# Patient Record
Sex: Female | Born: 1985
Health system: Southern US, Community
[De-identification: ages and names within clinical notes are randomized; demographics above are authoritative.]

## PROBLEM LIST (undated history)

## (undated) DIAGNOSIS — T7840XA Allergy, unspecified, initial encounter: Secondary | ICD-10-CM

## (undated) DIAGNOSIS — I8393 Asymptomatic varicose veins of bilateral lower extremities: Secondary | ICD-10-CM

## (undated) DIAGNOSIS — B009 Herpesviral infection, unspecified: Secondary | ICD-10-CM

## (undated) HISTORY — PX: CHOLECYSTECTOMY: SHX55

## (undated) HISTORY — DX: Allergy, unspecified, initial encounter: T78.40XA

## (undated) HISTORY — DX: Herpesviral infection, unspecified: B00.9

## (undated) HISTORY — DX: Asymptomatic varicose veins of bilateral lower extremities: I83.93

---

## 2017-05-26 MED FILL — ASHLYNA 0.15-0.03-0.01 MG T: 0.15-0.03 & | 91 days supply | Qty: 91 | Fill #0

## 2017-07-05 ENCOUNTER — Encounter: Payer: Self-pay | Admitting: Family Medicine

## 2017-07-05 ENCOUNTER — Ambulatory Visit (INDEPENDENT_AMBULATORY_CARE_PROVIDER_SITE_OTHER): Payer: 59 | Admitting: Family Medicine

## 2017-07-05 VITALS — BP 120/80 | HR 61 | Temp 97.8°F | Ht 66.0 in | Wt 168.6 lb

## 2017-07-05 DIAGNOSIS — J3089 Other allergic rhinitis: Secondary | ICD-10-CM | POA: Insufficient documentation

## 2017-07-05 DIAGNOSIS — Z0000 Encounter for general adult medical examination without abnormal findings: Secondary | ICD-10-CM | POA: Diagnosis not present

## 2017-07-05 DIAGNOSIS — J302 Other seasonal allergic rhinitis: Secondary | ICD-10-CM | POA: Diagnosis not present

## 2017-07-05 DIAGNOSIS — B009 Herpesviral infection, unspecified: Secondary | ICD-10-CM | POA: Diagnosis not present

## 2017-07-05 NOTE — Patient Instructions (Signed)
Preventive Care 18-39 Years, Female Preventive care refers to lifestyle choices and visits with your health care provider that can promote health and wellness. What does preventive care include?  A yearly physical exam. This is also called an annual well check.  Dental exams once or twice a year.  Routine eye exams. Ask your health care provider how often you should have your eyes checked.  Personal lifestyle choices, including: ? Daily care of your teeth and gums. ? Regular physical activity. ? Eating a healthy diet. ? Avoiding tobacco and drug use. ? Limiting alcohol use. ? Practicing safe sex. ? Taking vitamin and mineral supplements as recommended by your health care provider. What happens during an annual well check? The services and screenings done by your health care provider during your annual well check will depend on your age, overall health, lifestyle risk factors, and family history of disease. Counseling Your health care provider may ask you questions about your:  Alcohol use.  Tobacco use.  Drug use.  Emotional well-being.  Home and relationship well-being.  Sexual activity.  Eating habits.  Work and work Statistician.  Method of birth control.  Menstrual cycle.  Pregnancy history.  Screening You may have the following tests or measurements:  Height, weight, and BMI.  Diabetes screening. This is done by checking your blood sugar (glucose) after you have not eaten for a while (fasting).  Blood pressure.  Lipid and cholesterol levels. These may be checked every 5 years starting at age 66.  Skin check.  Hepatitis C blood test.  Hepatitis B blood test.  Sexually transmitted disease (STD) testing.  BRCA-related cancer screening. This may be done if you have a family history of breast, ovarian, tubal, or peritoneal cancers.  Pelvic exam and Pap test. This may be done every 3 years starting at age 40. Starting at age 59, this may be done every 5  years if you have a Pap test in combination with an HPV test.  Discuss your test results, treatment options, and if necessary, the need for more tests with your health care provider. Vaccines Your health care provider may recommend certain vaccines, such as:  Influenza vaccine. This is recommended every year.  Tetanus, diphtheria, and acellular pertussis (Tdap, Td) vaccine. You may need a Td booster every 10 years.  Varicella vaccine. You may need this if you have not been vaccinated.  HPV vaccine. If you are 69 or younger, you may need three doses over 6 months.  Measles, mumps, and rubella (MMR) vaccine. You may need at least one dose of MMR. You may also need a second dose.  Pneumococcal 13-valent conjugate (PCV13) vaccine. You may need this if you have certain conditions and were not previously vaccinated.  Pneumococcal polysaccharide (PPSV23) vaccine. You may need one or two doses if you smoke cigarettes or if you have certain conditions.  Meningococcal vaccine. One dose is recommended if you are age 27-21 years and a first-year college student living in a residence hall, or if you have one of several medical conditions. You may also need additional booster doses.  Hepatitis A vaccine. You may need this if you have certain conditions or if you travel or work in places where you may be exposed to hepatitis A.  Hepatitis B vaccine. You may need this if you have certain conditions or if you travel or work in places where you may be exposed to hepatitis B.  Haemophilus influenzae type b (Hib) vaccine. You may need this if  you have certain risk factors.  Talk to your health care provider about which screenings and vaccines you need and how often you need them. This information is not intended to replace advice given to you by your health care provider. Make sure you discuss any questions you have with your health care provider. Document Released: 04/05/2001 Document Revised: 10/28/2015  Document Reviewed: 12/09/2014 Elsevier Interactive Patient Education  [employer].

## 2017-07-05 NOTE — Progress Notes (Signed)
Subjective:     Emily Dudley is a 32 y.o. female and is here for a comprehensive physical exam and to est care. The patient reports no problems.  Patient is followed by OB/GYN in [facility] Dr. Delton See.  Taking cetirizine for seasonal allergies.  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  She moved to the area to live with her boyfriend.  Pt denies tobacco and drug use.  Pt endorses social EtOH use.  Pt does endorse a h/o genital HSV for which she take valtrex.  Pt does not have many outbreaks. Last outbreak was in January.   Social History   Socioeconomic History  . Marital status: Unknown    Spouse name: Not on file  . Number of children: Not on file  . Years of education: Not on file  . Highest education level: Not on file  Occupational History  . Not on file  Social Needs  . Financial resource strain: Not on file  . Food insecurity:    Worry: Not on file    Inability: Not on file  . Transportation needs:    Medical: Not on file    Non-medical: Not on file  Tobacco Use  . Smoking status: Not on file  Substance and Sexual Activity  . Alcohol use: Not on file  . Drug use: Not on file  . Sexual activity: Not on file  Lifestyle  . Physical activity:    Days per week: Not on file    Minutes per session: Not on file  . Stress: Not on file  Relationships  . Social connections:    Talks on phone: Not on file    Gets together: Not on file    Attends religious service: Not on file    Active member of club or organization: Not on file    Attends meetings of clubs or organizations: Not on file    Relationship status: Not on file  . Intimate partner violence:    Fear of current or ex partner: Not on file    Emotionally abused: Not on file    Physically abused: Not on file    Forced sexual activity: Not on file  Other Topics Concern  . Not on file  Social History Narrative  . Not on file   Health Maintenance  Topic Date Due  . HIV Screening  09/06/2002  . TETANUS/TDAP   09/06/2006  . PAP SMEAR  09/05/2008  . INFLUENZA VACCINE  09/21/2017    The following portions of the patient's history were reviewed and updated as appropriate: allergies, current medications, past family history, past medical history, past social history, past surgical history and problem list.  Review of Systems A comprehensive review of systems was negative.   Objective:    BP 120/80 (BP Location: Left Arm, Patient Position: Sitting, Cuff Size: Normal)   Pulse 61   Temp 97.8 F (36.6 C) (Oral)   Ht  (1.676 m)   Wt 168 lb 9.6 oz (76.5 kg)   SpO2 96%   BMI 27.21 kg/m  General appearance: alert, cooperative, appears stated age and no distress Head: Normocephalic, without obvious abnormality, atraumatic Eyes: conjunctivae/corneas clear. PERRL, EOM's intact. Fundi benign. Ears: normal TM's and external ear canals both ears Nose: Nares normal. Septum midline. Mucosa normal. No drainage or sinus tenderness. Throat: lips, mucosa, and tongue normal; teeth and gums normal Neck: no adenopathy, no JVD, supple, symmetrical, trachea midline and thyroid not enlarged, symmetric, no tenderness/mass/nodules Lungs: clear  to auscultation bilaterally Heart: regular rate and rhythm, S1, S2 normal, no murmur, click, rub or gallop Abdomen: soft, non-tender; bowel sounds normal; no masses,  no organomegaly Extremities: extremities normal, atraumatic, no cyanosis or edema Skin: Skin color, texture, turgor normal. No rashes or lesions Neurologic: Alert and oriented X 3, normal strength and tone. Normal symmetric reflexes. Normal coordination and gait    Assessment:    Healthy female exam.      Plan:      Anticipatory guidance given including wearing seatbelts, smoke detectors in the home, increasing physical activity, increasing p.o. intake of water and vegetables. -Pap up to date as followed by OB/Gyn -labs up to date -next CPE in 1 yr See After Visit Summary for Counseling  Recommendations    HSV -continue valtrex prn  F/u prn  Abbe Amsterdam, MD

## 2017-08-29 MED FILL — ASHLYNA 0.15-0.03-0.01 MG T: 0.15-0.03 & | 91 days supply | Qty: 91 | Fill #1

## 2017-12-08 MED FILL — DAYSEE 0.15-0.03-0.01 MG TA: 0.15-0.03 & | 91 days supply | Qty: 91 | Fill #2

## 2018-03-07 ENCOUNTER — Encounter: Payer: Self-pay | Admitting: Obstetrics and Gynecology

## 2018-03-07 ENCOUNTER — Ambulatory Visit (INDEPENDENT_AMBULATORY_CARE_PROVIDER_SITE_OTHER): Payer: 59 | Admitting: Obstetrics and Gynecology

## 2018-03-07 ENCOUNTER — Other Ambulatory Visit: Payer: Self-pay

## 2018-03-07 ENCOUNTER — Other Ambulatory Visit (HOSPITAL_COMMUNITY)
Admission: RE | Admit: 2018-03-07 | Discharge: 2018-03-07 | Disposition: A | Discharge status: Home / Self Care | Payer: 59 | Source: Ambulatory Visit | Attending: Obstetrics and Gynecology | Admitting: Obstetrics and Gynecology

## 2018-03-07 VITALS — BP 100/62 | HR 84 | Resp 16 | Ht 66.25 in | Wt 169.0 lb

## 2018-03-07 DIAGNOSIS — Z01419 Encounter for gynecological examination (general) (routine) without abnormal findings: Secondary | ICD-10-CM | POA: Insufficient documentation

## 2018-03-07 MED ORDER — LEVONORGEST-ETH ESTRAD 91-DAY 0.15-0.03 &0.01 MG PO TABS: 1.0000 | ORAL_TABLET | Freq: Every day | ORAL | 3 refills | Status: DC

## 2018-03-07 MED FILL — DAYSEE 0.15-0.03-0.01 MG TA: 0.15-0.03 & | 91 days supply | Qty: 91 | Fill #0

## 2018-03-07 NOTE — Patient Instructions (Signed)

## 2018-03-07 NOTE — Progress Notes (Signed)
33 y.o. G41P0000 Single Caucasian female here as a new patient for an annual exam.    Happy with COCs.  Taking OCPs since age 8.  Denies HTN, breast or liver disease, Hx of DVT or PE or FH of this.  Takes Valtrex as needed for HSV.  Never formally dx but partner has it.  Declines refills.   Engaged.  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   Doing 1/2 marathon in [place].  PCP: Abbe Amsterdam, MD    Patient's last menstrual period was 12/10/2017.     Period Cycle (Days): (every 3 months due to OCP) Period Duration (Days): 3 Period Pattern: Regular Menstrual Flow: Light Menstrual Control: Tampon Menstrual Control Change Freq (Hours): 4-6 Dysmenorrhea: None     Sexually active: Yes.    The current method of family planning is OCP (estrogen/progesterone).    Exercising: Yes.    running and weight lifting Smoker:  no  Health Maintenance: Pap:  03/31/17 Negative per Care Everywhere History of abnormal Pap:  no TDaP:  2014 Gardasil:   Yes, completed series in high school HIV and Hep C: 01/06/16 Negative Screening Labs: discuss if needed Flu vaccine:  Done.    reports that she has never smoked. She has never used smokeless tobacco. She reports current alcohol use. She reports that she does not use drugs.  Past Medical History:  Diagnosis Date  . HSV infection     Past Surgical History:  Procedure Laterality Date  . CHOLECYSTECTOMY      Current Outpatient Medications  Medication Sig Dispense Refill  . cetirizine (ZYRTEC) 10 MG tablet TAKE ONE TABLET BY MOUTH ONCE DAILY    . Levonorgestrel-Ethinyl Estradiol (AMETHIA,CAMRESE) 0.15-0.03 &0.01 MG tablet Take by mouth daily.    Marland Kitchen loratadine (CLARITIN) 10 MG tablet Take 10 mg by mouth as needed for allergies.    . valACYclovir (VALTREX) 1000 MG tablet Take 1,000 mg by mouth as needed.     No current facility-administered medications for this visit.     Family History  Problem Relation Age of Onset  . Aortic aneurysm Father   . Lung  cancer Maternal Grandmother   . Cancer Maternal Grandfather 96       prostate    Review of Systems  Constitutional: Negative.   HENT: Negative.   Eyes: Negative.   Respiratory: Negative.   Cardiovascular: Negative.   Gastrointestinal: Negative.   Endocrine: Negative.   Genitourinary: Negative.   Musculoskeletal: Negative.   Skin: Negative.   Allergic/Immunologic: Negative.   Neurological: Negative.   Hematological: Negative.   Psychiatric/Behavioral: Negative.     Exam:   BP 100/62 (BP Location: Right Arm, Patient Position: Sitting, Cuff Size: Normal)   Pulse 84   Resp 16   Ht 5' 6.25" (1.683 m)   Wt 169 lb (76.7 kg)   LMP 12/10/2017   BMI 27.07 kg/m     General appearance: alert, cooperative and appears stated age Head: Normocephalic, without obvious abnormality, atraumatic Neck: no adenopathy, supple, symmetrical, trachea midline and thyroid normal to inspection and palpation Lungs: clear to auscultation bilaterally Breasts: normal appearance, no masses or tenderness, No nipple retraction or dimpling, No nipple discharge or bleeding, No axillary or supraclavicular adenopathy Heart: regular rate and rhythm Abdomen: soft, non-tender; no masses, no organomegaly Extremities: extremities normal, atraumatic, no cyanosis or edema Skin: Skin color, texture, turgor normal. No rashes or lesions Lymph nodes: Cervical, supraclavicular, and axillary nodes normal. No abnormal inguinal nodes palpated Neurologic: Grossly normal  Pelvic:  External genitalia:  no lesions              Urethra:  normal appearing urethra with no masses, tenderness or lesions              Bartholins and Skenes: normal                 Vagina: normal appearing vagina with normal color and discharge, no lesions              Cervix: no lesions              Pap taken: Yes.   Bimanual Exam:  Uterus:  normal size, contour, position, consistency, mobility, non-tender              Adnexa: no mass, fullness,  tenderness    Chaperone was present for exam.  Assessment:   Well woman visit with normal exam. Hx HSV.  Rare outbreak.  Plan: Mammogram screening age 74. Recommended self breast awareness. Pap and HR HPV as above. Guidelines for Calcium, Vitamin D, regular exercise program including cardiovascular and weight bearing exercise. She will contact me if she needs refills of the Valtrex.  Follow up annually and prn.   After visit summary provided.

## 2018-03-09 LAB — CYTOLOGY - PAP
Diagnosis: NEGATIVE
HPV: NOT DETECTED

## 2018-05-18 MED FILL — DAYSEE 0.15-0.03-0.01 MG TA: 0.15-0.03 & | 91 days supply | Qty: 91 | Fill #3

## 2018-08-21 MED FILL — ASHLYNA 0.15-0.03-0.01 MG T: 0.15-0.03 & | 91 days supply | Qty: 91 | Fill #1

## 2018-08-29 ENCOUNTER — Encounter: Payer: Self-pay | Admitting: Family Medicine

## 2018-08-29 ENCOUNTER — Ambulatory Visit (INDEPENDENT_AMBULATORY_CARE_PROVIDER_SITE_OTHER): Payer: 59 | Admitting: Family Medicine

## 2018-08-29 ENCOUNTER — Other Ambulatory Visit: Payer: Self-pay

## 2018-08-29 VITALS — BP 108/78 | HR 82 | Temp 98.6°F | Wt 171.0 lb

## 2018-08-29 DIAGNOSIS — Z0000 Encounter for general adult medical examination without abnormal findings: Secondary | ICD-10-CM

## 2018-08-29 DIAGNOSIS — I83812 Varicose veins of left lower extremities with pain: Secondary | ICD-10-CM | POA: Diagnosis not present

## 2018-08-29 NOTE — Progress Notes (Signed)
Subjective:     Emily Dudley is a 33 y.o. female and is here for a comprehensive physical exam. The patient reports problems - varicose veins.  At times painful and itchy.  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  States work is going well, notes getting busier.  Otherwise pt doing well.  Pt is engaged, [place] planned [place] October 2020 [place], however unsure if she will be able to have it 2/2 COVID 19 pandemic.    Pap up to date.  OB/Gyn visit 03/07/18 with Dr. Judeth Horn Vaccines up to date including HPV. H/o HSV infection, valtrex prn. Social History   Socioeconomic History  . Marital status: Single    Spouse name: Not on file  . Number of children: Not on file  . Years of education: Not on file  . Highest education level: Not on file  Occupational History  . Not on file  Social Needs  . Financial resource strain: Not on file  . Food insecurity    Worry: Not on file    Inability: Not on file  . Transportation needs    Medical: Not on file    Non-medical: Not on file  Tobacco Use  . Smoking status: Never Smoker  . Smokeless tobacco: Never Used  Substance and Sexual Activity  . Alcohol use: Yes    Comment: socially  . Drug use: Never  . Sexual activity: Yes    Birth control/protection: Pill  Lifestyle  . Physical activity    Days per week: Not on file    Minutes per session: Not on file  . Stress: Not on file  Relationships  . Social Herbalist on phone: Not on file    Gets together: Not on file    Attends religious service: Not on file    Active member of club or organization: Not on file    Attends meetings of clubs or organizations: Not on file    Relationship status: Not on file  . Intimate partner violence    Fear of current or ex partner: Not on file    Emotionally abused: Not on file    Physically abused: Not on file    Forced sexual activity: Not on file  Other Topics Concern  . Not on file  Social History Narrative  . Not on  file   Health Maintenance  Topic Date Due  . HIV Screening  09/06/2002  . INFLUENZA VACCINE  09/22/2018  . PAP SMEAR-Modifier  03/07/2021  . TETANUS/TDAP  06/11/2022    The following portions of the patient's history were reviewed and updated as appropriate: allergies, current medications, past family history, past medical history, past social history, past surgical history and problem list.  Review of Systems Pertinent items noted in HPI and remainder of comprehensive ROS otherwise negative.   Objective:    BP 108/78 (BP Location: Left Arm, Patient Position: Sitting, Cuff Size: Normal)   Pulse 82   Temp 98.6 F (37 C) (Oral)   Wt 171 lb (77.6 kg)   LMP 06/11/2018 (Exact Date)   SpO2 95%   BMI 27.39 kg/m  General appearance: alert, cooperative, appears stated age and no distress Head: Normocephalic, without obvious abnormality, atraumatic Eyes: conjunctivae/corneas clear. PERRL, EOM's intact. Fundi benign. Ears: normal TM's and external ear canals both ears Nose: Nares normal. Septum midline. Mucosa normal. No drainage or sinus tenderness. Throat: lips, mucosa, and tongue normal; teeth and gums normal  Neck: no adenopathy, no carotid bruit, no JVD, supple, symmetrical, trachea midline and thyroid not enlarged, symmetric, no tenderness/mass/nodules Lungs: clear to auscultation bilaterally Heart: regular rate and rhythm, S1, S2 normal, no murmur, click, rub or gallop Abdomen: soft, non-tender; bowel sounds normal; no masses,  no organomegaly Extremities: extremities normal, atraumatic, no cyanosis or edema Pulses: 2+ and symmetric Skin: Skin color, texture, turgor normal. No rashes or lesions Varicose veins noted on L medial thigh. Lymph nodes: Cervical, supraclavicular, and axillary nodes normal. Neurologic: Alert and oriented X 3, normal strength and tone. Normal symmetric reflexes. Normal coordination and gait    Assessment:    Healthy female exam with varicose veins.      Plan:     Anticipatory guidance given including wearing seatbelts, smoke detectors in the home, increasing physical activity, increasing p.o. intake of water and vegetables. -pt declines labs at this time. -Pap up to date.  Completed 03/07/2018 by OB/Gyn -given handout See After Visit Summary for Counseling Recommendations    Varicose veins of left lower extremity with pain  -continue supportive care: TED hose/compression stockings - Plan: Ambulatory referral to Vascular Surgery  F/u prn  Abbe AmsterdamShannon Henlee Donovan, MD

## 2018-08-29 NOTE — Patient Instructions (Signed)
Preventive Care 21-31 Years Old, Female Preventive care refers to visits with your health care provider and lifestyle choices that can promote health and wellness. This includes:  A yearly physical exam. This may also be called an annual well check.  Regular dental visits and eye exams.  Immunizations.  Screening for certain conditions.  Healthy lifestyle choices, such as eating a healthy diet, getting regular exercise, not using drugs or products that contain nicotine and tobacco, and limiting alcohol use. What can I expect for my preventive care visit? Physical exam Your health care provider will check your:  Height and weight. This may be used to calculate body mass index (BMI), which tells if you are at a healthy weight.  Heart rate and blood pressure.  Skin for abnormal spots. Counseling Your health care provider may ask you questions about your:  Alcohol, tobacco, and drug use.  Emotional well-being.  Home and relationship well-being.  Sexual activity.  Eating habits.  Work and work environment.  Method of birth control.  Menstrual cycle.  Pregnancy history. What immunizations do I need?  Influenza (flu) vaccine  This is recommended every year. Tetanus, diphtheria, and pertussis (Tdap) vaccine  You may need a Td booster every 10 years. Varicella (chickenpox) vaccine  You may need this if you have not been vaccinated. Human papillomavirus (HPV) vaccine  If recommended by your health care provider, you may need three doses over 6 months. Measles, mumps, and rubella (MMR) vaccine  You may need at least one dose of MMR. You may also need a second dose. Meningococcal conjugate (MenACWY) vaccine  One dose is recommended if you are age 19-21 years and a first-year college student living in a residence hall, or if you have one of several medical conditions. You may also need additional booster doses. Pneumococcal conjugate (PCV13) vaccine  You may need  this if you have certain conditions and were not previously vaccinated. Pneumococcal polysaccharide (PPSV23) vaccine  You may need one or two doses if you smoke cigarettes or if you have certain conditions. Hepatitis A vaccine  You may need this if you have certain conditions or if you travel or work in places where you may be exposed to hepatitis A. Hepatitis B vaccine  You may need this if you have certain conditions or if you travel or work in places where you may be exposed to hepatitis B. Haemophilus influenzae type b (Hib) vaccine  You may need this if you have certain conditions. You may receive vaccines as individual doses or as more than one vaccine together in one shot (combination vaccines). Talk with your health care provider about the risks and benefits of combination vaccines. What tests do I need?  Blood tests  Lipid and cholesterol levels. These may be checked every 5 years starting at age 20.  Hepatitis C test.  Hepatitis B test. Screening  Diabetes screening. This is done by checking your blood sugar (glucose) after you have not eaten for a while (fasting).  Sexually transmitted disease (STD) testing.  BRCA-related cancer screening. This may be done if you have a family history of breast, ovarian, tubal, or peritoneal cancers.  Pelvic exam and Pap test. This may be done every 3 years starting at age 21. Starting at age 30, this may be done every 5 years if you have a Pap test in combination with an HPV test. Talk with your health care provider about your test results, treatment options, and if necessary, the need for more tests.   Follow these instructions at home: Eating and drinking   Eat a diet that includes fresh fruits and vegetables, whole grains, lean protein, and low-fat dairy.  Take vitamin and mineral supplements as recommended by your health care provider.  Do not drink alcohol if: ? Your health care provider tells you not to drink. ? You are  pregnant, may be pregnant, or are planning to become pregnant.  If you drink alcohol: ? Limit how much you have to 0-1 drink a day. ? Be aware of how much alcohol is in your drink. In the [place] one drink equals one 12 oz bottle of beer (355 mL), one 5 oz glass of wine (148 mL), or one 1 oz glass of hard liquor (44 mL). Lifestyle  Take daily care of your teeth and gums.  Stay active. Exercise for at least 30 minutes on 5 or more days each week.  Do not use any products that contain nicotine or tobacco, such as cigarettes, e-cigarettes, and chewing tobacco. If you need help quitting, ask your health care provider.  If you are sexually active, practice safe sex. Use a condom or other form of birth control (contraception) in order to prevent pregnancy and STIs (sexually transmitted infections). If you plan to become pregnant, see your health care provider for a preconception visit. What's next?  Visit your health care provider once a year for a well check visit.  Ask your health care provider how often you should have your eyes and teeth checked.  Stay up to date on all vaccines. This information is not intended to replace advice given to you by your health care provider. Make sure you discuss any questions you have with your health care provider. Document Released: 04/05/2001 Document Revised: 10/19/2017 Document Reviewed: 10/19/2017 Elsevier Patient Education  2020 [employer].  Varicose Veins Varicose veins are veins that have become enlarged, bulged, and twisted. They most often appear in the legs. What are the causes? This condition is caused by damage to the valves in the vein. These valves help blood return to your heart. When they are damaged and they stop working properly, blood may flow backward and back up in the veins near the skin, causing the veins to get larger and appear twisted. The condition can result from any issue that causes blood to back up, like pregnancy,  prolonged standing, or obesity. What increases the risk? This condition is more likely to develop in people who are:  On their feet a lot.  Pregnant.  Overweight. What are the signs or symptoms? Symptoms of this condition include:  Bulging, twisted, and bluish veins.  A feeling of heaviness. This may be worse at the end of the day.  Leg pain. This may be worse at the end of the day.  Swelling in the leg.  Changes in skin color over the veins. How is this diagnosed? This condition may be diagnosed based on your symptoms, a physical exam, and an ultrasound test. How is this treated? Treatment for this condition may involve:  Avoiding sitting or standing in one position for long periods of time.  Wearing compression stockings. These stockings help to prevent blood clots and reduce swelling in the legs.  Raising (elevating) the legs when resting.  Losing weight.  Exercising regularly. If you have persistent symptoms or want to improve the way your varicose veins look, you may choose to have a procedure to close the varicose veins off or to remove them. Treatments to close off the  veins include:  Sclerotherapy. In this treatment, a solution is injected into a vein to close it off.  Laser treatment. In this treatment, the vein is heated with a laser to close it off.  Radiofrequency vein ablation. In this treatment, an electrical current produced by radio waves is used to close off the vein. Treatments to remove the veins include:  Phlebectomy. In this treatment, the veins are removed through small incisions made over the veins.  Vein ligation and stripping. In this treatment, incisions are made over the veins. The veins are then removed after being tied (ligated) with stitches (sutures). Follow these instructions at home: Activity  Walk as much as possible. Walking increases blood flow. This helps blood return to the heart and takes pressure off your veins. It also  increases your cardiovascular strength.  Follow your health care provider's instructions about exercising.  Do not stand or sit in one position for a long period of time.  Do not sit with your legs crossed.  Rest with your legs raised during the day. General instructions   Follow any diet instructions given to you by your health care provider.  Wear compression stockings as directed by your health care provider. Do not wear other kinds of tight clothing around your legs, pelvis, or waist.  Elevate your legs at night to above the level of your heart.  If you get a cut in the skin over the varicose vein and the vein bleeds: ? Lie down with your leg raised. ? Apply firm pressure to the cut with a clean cloth until the bleeding stops. ? Place a bandage (dressing) on the cut. Contact a health care provider if:  The skin around your varicose veins starts to break down.  You have pain, redness, tenderness, or hard swelling over a vein.  You are uncomfortable because of pain.  You get a cut in the skin over a varicose vein and it will not stop bleeding. Summary  Varicose veins are veins that have become enlarged, bulged, and twisted. They most often appear in the legs.  This condition is caused by damage to the valves in the vein. These valves help blood return to your heart.  Treatment for this condition includes frequent movements, wearing compression stockings, losing weight, and exercising regularly. In some cases, procedures are done to close off or remove the veins.  Treatment for this condition may include wearing compression stockings, elevating the legs, losing weight, and engaging in regular activity. In some cases, procedures are done to close off or remove the veins. This information is not intended to replace advice given to you by your health care provider. Make sure you discuss any questions you have with your health care provider. Document Released: 11/17/2004  Document Revised: 04/05/2018 Document Reviewed: 03/02/2016 Elsevier Patient Education  2020 [place].  Nonsurgical Procedures for Varicose Veins Various nonsurgical procedures can be used to treat varicose veins. Varicose veins are swollen, twisted veins that are visible under the skin. They occur most often in the legs. These veins may appear blue and bulging. Varicose veins are caused by damage to the valves in veins. All veins have a valve that makes blood flow in only one direction. If a valve gets weak or damaged, blood can pool and cause varicose veins. You may need a procedure to treat your varicose veins if they are causing symptoms or complications, or if lifestyle changes have not helped. These procedures can reduce pain, aching, and the risk of bleeding  and blood clots. They can also improve the way the affected area looks (cosmetic appearance). The three common nonsurgical procedures are:  Sclerotherapy. A chemical is injected to close off a vein.  Laser treatment. Light energy is applied to close off the vein.  Radiofrequency vein ablation. Electrical energy is used to produce heat that closes off the vein. Your health care provider will discuss the method that is best for you based on your condition. Tell a health care provider about:  Any allergies you have.  All medicines you are taking, including vitamins, herbs, eye drops, creams, and over-the-counter medicines.  Any problems you or family members have had with anesthetic medicines.  Any blood disorders you have.  Any surgeries you have had.  Any medical conditions you have.  Whether you are pregnant or may be pregnant. What are the risks? Generally, this is a safe procedure. However, problems may occur, including:  Damage to nearby nerves, tissues, or veins.  Skin irritation, sores, or dark spots.  Numbness.  Clotting.  Infection.  Allergic reactions to medicines.  Scarring.  Leg swelling.   Need for additional treatments.  Bruising. What happens before the procedure?  Ask your health care provider about: ? Changing or stopping your regular medicines. This is especially important if you are taking diabetes medicines or blood thinners. ? Taking over-the-counter medicines, vitamins, herbs, and supplements. ? Taking medicines such as aspirin and ibuprofen. These medicines can thin your blood. Do not take these medicines unless your health care provider tells you to take them.  You may have an exam or testing. This can include a tests to: ? Check for clots and check blood flow using sound waves (Doppler ultrasound). ? Observe how blood flows through your veins by injecting a dye that outlines your veins on X-rays (angiogram). This test is used in rare cases. What happens during the procedure? One of the following procedures will be performed: Sclerotherapy This procedure is often used for small to medium veins.  A chemical (sclerosant) that irritates the lining of the vein will be injected into the vein. This will cause the varicose vein to be closed off. Sclerosants in different amounts and strengths can be used, depending on the size and location of the vein.  All of the varicose vein sites will be injected. You may need more than one treatment because new varicose veins may develop, or more than one injection may be needed for each varicose vein.  Laser treatment There are two ways that lasers are used to treat varicose veins:  Light energy from a laser may be directed onto the vein through the skin.  A needle may be used to pass a thin laser catheter into the vein to cause it to close. You may need more than one treatment if the vein re-opens. In some cases, laser treatment may be combined with sclerotherapy. Radiofrequency vein ablation   You will be given a medicine that numbs the area (local anesthetic).  A small incision will be made near the varicose vein.  A  thin tube (catheter) will be threaded into your vein.  The tip of the catheter will deploy electrodes.  The electrodes will deliver electrical energy to produce heat that closes off the vein. What happens after the procedure?  A bandage (dressing) may be used to cover the injection site or incisions.  You may have to wear compression stockings. These stockings help to prevent blood clots and reduce swelling in your legs.  Return to  your normal activities as told by your health care provider. Summary  Varicose veins are swollen, twisted veins that are visible under the skin. They occur most often in the legs.  Various procedures can be used to treat varicose veins. You may need a procedure to treat your varicose veins if they are causing symptoms or complications, or if lifestyle changes have not helped.  Your health care provider will discuss the method that is best for you based on your condition. This information is not intended to replace advice given to you by your health care provider. Make sure you discuss any questions you have with your health care provider. Document Released: 05/20/2016 Document Revised: 06/01/2018 Document Reviewed: 05/20/2016 Elsevier Patient Education  2020 [employer].

## 2018-10-10 ENCOUNTER — Other Ambulatory Visit: Payer: Self-pay

## 2018-10-10 DIAGNOSIS — I83893 Varicose veins of bilateral lower extremities with other complications: Secondary | ICD-10-CM

## 2018-10-16 ENCOUNTER — Ambulatory Visit (HOSPITAL_COMMUNITY)
Admission: RE | Admit: 2018-10-16 | Discharge: 2018-10-16 | Disposition: A | Discharge status: Home / Self Care | Payer: 59 | Source: Ambulatory Visit | Attending: Vascular Surgery | Admitting: Vascular Surgery

## 2018-10-16 ENCOUNTER — Encounter: Payer: Self-pay | Admitting: Vascular Surgery

## 2018-10-16 ENCOUNTER — Ambulatory Visit (INDEPENDENT_AMBULATORY_CARE_PROVIDER_SITE_OTHER): Payer: 59 | Admitting: Vascular Surgery

## 2018-10-16 ENCOUNTER — Other Ambulatory Visit: Payer: Self-pay

## 2018-10-16 DIAGNOSIS — I872 Venous insufficiency (chronic) (peripheral): Secondary | ICD-10-CM | POA: Insufficient documentation

## 2018-10-16 DIAGNOSIS — I83893 Varicose veins of bilateral lower extremities with other complications: Secondary | ICD-10-CM | POA: Insufficient documentation

## 2018-10-16 NOTE — Progress Notes (Signed)
Patient name: Emily Dudley MRN: [id number] DOB: 1985/05/04 Sex: female  REASON FOR CONSULT: Varicose veins  HPI: Emily Dudley is a 33 y.o. female, with no pertinent past medical history that presents for evaluation of varicosities in her left lower extremity.  Patient states she has had varicose veins for years but they have gotten progressively worse recently.  Her left leg is much more bothersome than her right.  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  She states no previous venous interventions.  No history of DVT no blood clot.  She was wearing at one time knee-high compression now she is wearing thigh-high compression.  Mostly describes aching and burning down left leg - medial thigh and back of left calf where she has apparent varicosities.    Past Medical History:  Diagnosis Date  . HSV infection     Past Surgical History:  Procedure Laterality Date  . CHOLECYSTECTOMY      Family History  Problem Relation Age of Onset  . Aortic aneurysm Father   . Lung cancer Maternal Grandmother   . Cancer Maternal Grandfather 67       prostate    SOCIAL HISTORY: Social History   Socioeconomic History  . Marital status: Single    Spouse name: Not on file  . Number of children: Not on file  . Years of education: Not on file  . Highest education level: Not on file  Occupational History  . Not on file  Social Needs  . Financial resource strain: Not on file  . Food insecurity    Worry: Not on file    Inability: Not on file  . Transportation needs    Medical: Not on file    Non-medical: Not on file  Tobacco Use  . Smoking status: Never Smoker  . Smokeless tobacco: Never Used  Substance and Sexual Activity  . Alcohol use: Yes    Comment: socially  . Drug use: Never  . Sexual activity: Yes    Birth control/protection: Pill  Lifestyle  . Physical activity    Days per week: Not on file    Minutes per session: Not on file   . Stress: Not on file  Relationships  . Social Herbalist on phone: Not on file    Gets together: Not on file    Attends religious service: Not on file    Active member of club or organization: Not on file    Attends meetings of clubs or organizations: Not on file    Relationship status: Not on file  . Intimate partner violence    Fear of current or ex partner: Not on file    Emotionally abused: Not on file    Physically abused: Not on file    Forced sexual activity: Not on file  Other Topics Concern  . Not on file  Social History Narrative  . Not on file    No Known Allergies  Current Outpatient Medications  Medication Sig Dispense Refill  . Levonorgestrel-Ethinyl Estradiol (AMETHIA,CAMRESE) 0.15-0.03 &0.01 MG tablet Take 1 tablet by mouth daily. 1 Package 3  . cetirizine (ZYRTEC) 10 MG tablet TAKE ONE TABLET BY MOUTH ONCE DAILY    . loratadine (CLARITIN) 10 MG tablet Take 10 mg by mouth as needed for allergies.    . valACYclovir (VALTREX) 1000 MG tablet Take 1,000 mg by mouth as needed.  No current facility-administered medications for this visit.     REVIEW OF SYSTEMS:  [X]  denotes positive finding, [ ]  denotes negative finding Cardiac  Comments:  Chest pain or chest pressure:    Shortness of breath upon exertion:    Short of breath when lying flat:    Irregular heart rhythm:        Vascular    Pain in calf, thigh, or hip brought on by ambulation:    Pain in feet at night that wakes you up from your sleep:     Blood clot in your veins:    Leg swelling:         Pulmonary    Oxygen at home:    Productive cough:     Wheezing:         Neurologic    Sudden weakness in arms or legs:     Sudden numbness in arms or legs:     Sudden onset of difficulty speaking or slurred speech:    Temporary loss of vision in one eye:     Problems with dizziness:         Gastrointestinal    Blood in stool:     Vomited blood:         Genitourinary    Burning when  urinating:     Blood in urine:        Psychiatric    Major depression:         Hematologic    Bleeding problems:    Problems with blood clotting too easily:        Skin    Rashes or ulcers:        Constitutional    Fever or chills:      PHYSICAL EXAM: Vitals:   10/16/18 1329  BP: 103/69  Pulse: 80  Resp: 16  Temp: 98.4 F (36.9 C)  TempSrc: Temporal  SpO2: 99%  Weight: 172 lb (78 kg)  Height: 5\' 6"  (1.676 m)    GENERAL: The patient is a well-nourished female, in no acute distress. The vital signs are documented above. CARDIAC: There is a regular rate and rhythm.  VASCULAR:  2+ radial pulses palpable bilaterally 2+ femoral pulses palpable bilaterally 2+ DP pulses palpable bilaterally Varicosities along medial left thigh and back of left calf PULMONARY: There is good air exchange bilaterally without wheezing or rales. ABDOMEN: Soft and non-tender. MUSCULOSKELETAL: There are no major deformities or cyanosis. NEUROLOGIC: No focal weakness or paresthesias are detected. SKIN: There are no ulcers or rashes noted. PSYCHIATRIC: The patient has a normal affect.  DATA:   I independently reviewed her left lower extremity venous reflux study and abnormal reflux times throughout the left GSV from the saphenofemoral junction down to the mid thigh.  Veins fairly dilated near 7 mm at saphenofemoral junctionand 7 mm at the knee. She also has left SSV reflux, but small.  Assessment/Plan:  33 year old female who presents with left lower extremity venous insufficiency with CEAP classification C2.  Her left leg is more symptomatic and she only got a reflux study of the left leg today.  That being said she has pathologic reflux throughout the great saphenous vein from the saphenofemoral junction down to the mid calf.  She is already wearing thigh high compression but we are recommending conservative measures with ongoing thigh-high compression 20-30 mmHg with leg elevation.  Will have her  come back in 3 months to be evaluated for endovenous ablation with Dr. Edilia Boickson or Dr. Darrick PennaFields  after she has had complete course of conservative therapy.   Cephus Shelling, MD [facility] Office: [phone] Pager: [phone]

## 2018-10-19 ENCOUNTER — Encounter: Payer: Self-pay | Admitting: Family Medicine

## 2018-11-25 ENCOUNTER — Encounter: Payer: Self-pay | Admitting: Family Medicine

## 2018-11-26 MED FILL — ASHLYNA 0.15-0.03-0.01 MG T: 0.15-0.03 & | 91 days supply | Qty: 91 | Fill #2

## 2018-12-04 ENCOUNTER — Other Ambulatory Visit: Payer: Self-pay

## 2018-12-04 MED ORDER — VALACYCLOVIR HCL 1 G PO TABS: 1000.0000 mg | ORAL_TABLET | ORAL | 0 refills | Status: DC | PRN

## 2018-12-04 MED FILL — valACYclovir HCL 1 GM TABS: 1 | 30 days supply | Qty: 30 | Fill #0

## 2019-01-23 ENCOUNTER — Other Ambulatory Visit: Payer: Self-pay

## 2019-01-23 DIAGNOSIS — I872 Venous insufficiency (chronic) (peripheral): Secondary | ICD-10-CM

## 2019-01-23 DIAGNOSIS — I83893 Varicose veins of bilateral lower extremities with other complications: Secondary | ICD-10-CM

## 2019-01-24 ENCOUNTER — Other Ambulatory Visit: Payer: Self-pay

## 2019-01-24 ENCOUNTER — Ambulatory Visit (HOSPITAL_COMMUNITY)
Admission: RE | Admit: 2019-01-24 | Discharge: 2019-01-24 | Disposition: A | Discharge status: Home / Self Care | Payer: 59 | Source: Ambulatory Visit | Attending: Family | Admitting: Family

## 2019-01-24 ENCOUNTER — Encounter: Payer: Self-pay | Admitting: Vascular Surgery

## 2019-01-24 ENCOUNTER — Ambulatory Visit (INDEPENDENT_AMBULATORY_CARE_PROVIDER_SITE_OTHER): Payer: 59 | Admitting: Vascular Surgery

## 2019-01-24 VITALS — BP 107/70 | HR 77 | Temp 98.2°F | Resp 16 | Ht 66.0 in | Wt 177.0 lb

## 2019-01-24 DIAGNOSIS — I83893 Varicose veins of bilateral lower extremities with other complications: Secondary | ICD-10-CM | POA: Diagnosis not present

## 2019-01-24 DIAGNOSIS — I872 Venous insufficiency (chronic) (peripheral): Secondary | ICD-10-CM | POA: Diagnosis not present

## 2019-01-24 NOTE — Progress Notes (Signed)
Patient name: Emily Dudley MRN: [id number] DOB: [birth year] Sex: female  REASON FOR VISIT:   17-month follow-up visit.  HPI:   Emily Dudley is a pleasant 33 y.o. female who was seen in our office by Dr. Fortunato Curling on 10/16/2018 with varicose veins.  At that time she was complaining of aching pain and burning in her legs.  She was encouraged to continue wearing her thigh-high compression stockings with a gradient of 20 to 30 mmHg, elevate her legs, and take ibuprofen as needed for pain.  CURRENT SYMPTOMS: Despite conservative treatment she is continuing to have aching pain, throbbing, and fatigue in both legs.  Her symptoms are more significant on the left side.  She also has some burning pain.  She also describes some mild swelling.  Her symptoms are aggravated by standing.  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  Her symptoms are worse after working.  Her symptoms are relieved with elevation.  She does have to take ibuprofen at times for pain.  Her symptoms have gradually progressed.  CONSERVATIVE TREATMENT TO DATE: The patient has tried thigh-high compression stockings with a gradient of 20-30, leg elevation, and ibuprofen as needed for pain.  These helped her symptoms some especially the leg elevation.  VENOUS INTERVENTIONS: The patient has had no prior venous interventions.  CEAP: C2  Past Medical History:  Diagnosis Date   HSV infection     Family History  Problem Relation Age of Onset   Aortic aneurysm Father    Lung cancer Maternal Grandmother    Cancer Maternal Grandfather 20       prostate    SOCIAL HISTORY: Social History   Tobacco Use   Smoking status: Never Smoker   Smokeless tobacco: Never Used  Substance Use Topics   Alcohol use: Yes    Comment: socially    No Known Allergies  Current Outpatient Medications  Medication Sig Dispense Refill   cetirizine (ZYRTEC) 10 MG tablet TAKE  ONE TABLET BY MOUTH ONCE DAILY     Levonorgestrel-Ethinyl Estradiol (AMETHIA,CAMRESE) 0.15-0.03 &0.01 MG tablet Take 1 tablet by mouth daily. 1 Package 3   loratadine (CLARITIN) 10 MG tablet Take 10 mg by mouth as needed for allergies.     valACYclovir (VALTREX) 1000 MG tablet Take 1 tablet (1,000 mg total) by mouth as needed. 30 tablet 0   No current facility-administered medications for this visit.     REVIEW OF SYSTEMS:  [X]  denotes positive finding, [ ]  denotes negative finding Cardiac  Comments:  Chest pain or chest pressure:    Shortness of breath upon exertion:    Short of breath when lying flat:    Irregular heart rhythm:        Vascular    Pain in calf, thigh, or hip brought on by ambulation:    Pain in feet at night that wakes you up from your sleep:     Blood clot in your veins:    Leg swelling:         Pulmonary    Oxygen at home:    Productive cough:     Wheezing:         Neurologic    Sudden weakness in arms or legs:     Sudden numbness in arms or legs:     Sudden onset of difficulty speaking or slurred speech:    Temporary loss of vision in one eye:  Problems with dizziness:         Gastrointestinal    Blood in stool:     Vomited blood:         Genitourinary    Burning when urinating:     Blood in urine:        Psychiatric    Major depression:         Hematologic    Bleeding problems:    Problems with blood clotting too easily:        Skin    Rashes or ulcers:        Constitutional    Fever or chills:     PHYSICAL EXAM:   There were no vitals filed for this visit.  GENERAL: The patient is a well-nourished female, in no acute distress. The vital signs are documented above. CARDIAC: There is a regular rate and rhythm.  VASCULAR: I do not detect carotid bruits. I could not palpate pedal pulses however she had biphasic posterior tibial signals with the Doppler. She has a cluster of varicose veins in her medial left thigh and lateral left  leg as documented in the photographs below.     I did look at her left great saphenous vein myself with the SonoSite.  She has reflux from the saphenofemoral junction to the calf.  The vein is significantly dilated to the level of the knee.  It does narrow down some at the level of the cluster of varicose veins in her medial thigh.  PULMONARY: There is good air exchange bilaterally without wheezing or rales. ABDOMEN: Soft and non-tender with normal pitched bowel sounds.  MUSCULOSKELETAL: There are no major deformities or cyanosis. NEUROLOGIC: No focal weakness or paresthesias are detected. SKIN: There are no ulcers or rashes noted. PSYCHIATRIC: The patient has a normal affect.  DATA:    VENOUS DUPLEX LEFT LOWER EXTREMITY: I did review the venous duplex scan of the left lower extremity that was done in August of this year.  There was no evidence of DVT or superficial thrombophlebitis.  There was deep venous reflux involving the common femoral vein.  There was superficial venous reflux involving the great saphenous vein from the saphenofemoral junction to the calf.  The vein was dilated up to 0.7 cm at the level of the knee.  VENOUS DUPLEX RIGHT LOWER EXTREMITY: I have independently interpreted the venous duplex scan of the right lower extremity that was done today.  On the right side there is no evidence of DVT or superficial venous thrombosis.  There is no deep venous reflux.  There is superficial venous reflux involving the right great saphenous vein.  However the vein is not especially dilated.  MEDICAL ISSUES:   PAINFUL VARICOSE VEINS BILATERALLY: This patient has painful varicose veins bilaterally which are worse on the left side.  She is failed conservative treatment as documented above.  I think she would be a good candidate for laser ablation of the left great saphenous vein.  The vein could likely be cannulated to the level of the knee.  There is a narrow segment at the level of the  cluster of varicose veins and if the wire would not pass through this area then we could cannulate above the cluster of varicose veins and address the reflux in the proximal thigh.  At the same time I think she would be a good candidate for 10-20 stabs.  I have discussed the indications for endovenous laser ablation of the left GSV, that is to  lower the pressure in the veins and potentially help relieve the symptoms from venous hypertension. I have also discussed alternative options including conservative treatment with leg elevation, compression therapy, exercise, avoiding prolonged sitting and standing, and weight management. I have discussed the potential complications of the procedure, including, but not limited to: bleeding, bruising, leg swelling, nerve injury, skin burns, significant pain from phlebitis, deep venous thrombosis, or failure of the vein to close.  I have also explained that venous insufficiency is a chronic disease, and that the patient is at risk for recurrent varicose veins in the future.  All of the patient's questions were encouraged and answered. They are agreeable to proceed.   I have discussed with the patient the indications for stab phlebectomy.  I have explained to the patient that that will have small scars from the stab incisions.  I explained that the other risks include leg swelling, bruising, bleeding, and phlebitis.  All the patient's questions were encouraged and answered and they are agreeable to proceed.  I would likely see her back 6 months after her laser ablation on the left to continue to follow her venous disease on the right side.  For now her symptoms are more tolerable on the right and she will continue with conservative treatment.  If her venous disease progresses she may ultimately require endovenous laser ablation of the right great saphenous vein.   Waverly Ferrarihristopher Flois [place] [facility] [phone]

## 2019-02-07 ENCOUNTER — Encounter: Payer: Self-pay | Admitting: Vascular Surgery

## 2019-02-28 MED FILL — ASHLYNA 0.15-0.03-0.01 MG T: 0.15-0.03 & | 91 days supply | Qty: 91 | Fill #3

## 2019-03-05 ENCOUNTER — Other Ambulatory Visit: Payer: Self-pay | Admitting: *Deleted

## 2019-03-05 DIAGNOSIS — I83812 Varicose veins of left lower extremities with pain: Secondary | ICD-10-CM

## 2019-03-09 ENCOUNTER — Encounter: Payer: Self-pay | Admitting: Obstetrics and Gynecology

## 2019-03-11 ENCOUNTER — Other Ambulatory Visit: Payer: Self-pay

## 2019-03-12 NOTE — Progress Notes (Signed)
34 y.o. G83P0000 Married Caucasian female here for annual exam.    Happy with her birth control.  Not interested in childbearing.   Married in October.  Got a puppy. Works in the [employer].    PCP: Grier Mitts, MD    Patient's last menstrual period was 03/10/2019 (exact date).           Sexually active: Yes.    The current method of family planning is OCP (estrogen/progesterone).    Exercising: No.  The patient does not participate in regular exercise at present. Smoker:  no  Health Maintenance: Pap: 03-07-18 Neg:Neg HR HPV,03-31-17 Neg History of abnormal Pap:  no MMG:  n/a Colonoscopy:  n/a BMD:   n/a  Result  n/a TDaP: 2014 Gardasil:   Yes, completed HIV: 01-06-16 Neg Hep C: 01-06-16 Neg Screening Labs:  Declines.  Covid vaccine:  Completed.  Flu vaccine:  Completed.    reports that she has never smoked. She has never used smokeless tobacco. She reports current alcohol use. She reports that she does not use drugs.  Past Medical History:  Diagnosis Date  . HSV infection   . Varicose veins of legs     Past Surgical History:  Procedure Laterality Date  . CHOLECYSTECTOMY      Current Outpatient Medications  Medication Sig Dispense Refill  . cetirizine (ZYRTEC) 10 MG tablet TAKE ONE TABLET BY MOUTH ONCE DAILY    . Levonorgestrel-Ethinyl Estradiol (AMETHIA,CAMRESE) 0.15-0.03 &0.01 MG tablet Take 1 tablet by mouth daily. 1 Package 3  . valACYclovir (VALTREX) 1000 MG tablet Take 1 tablet (1,000 mg total) by mouth as needed. (Patient not taking: Reported on 01/24/2019) 30 tablet 0   No current facility-administered medications for this visit.    Family History  Problem Relation Age of Onset  . Aortic aneurysm Father   . Lung cancer Maternal Grandmother   . Cancer Maternal Grandfather 39       prostate    Review of Systems  All other systems reviewed and are negative.   Exam:   BP 110/66 (Cuff Size: Large)   Pulse 78   Temp (!) 97.1 F (36.2 C) (Temporal)   Resp  14   Ht 5' 6.5" (1.689 m)   Wt 179 lb 12.8 oz (81.6 kg)   LMP 03/10/2019 (Exact Date)   BMI 28.59 kg/m     General appearance: alert, cooperative and appears stated age Head: normocephalic, without obvious abnormality, atraumatic Neck: no adenopathy, supple, symmetrical, trachea midline and thyroid normal to inspection and palpation Lungs: clear to auscultation bilaterally Breasts: normal appearance, no masses or tenderness, No nipple retraction or dimpling, No nipple discharge or bleeding, No axillary adenopathy Heart: regular rate and rhythm Abdomen: soft, non-tender; no masses, no organomegaly Extremities: extremities normal, atraumatic, no cyanosis or edema Skin: skin color, texture, turgor normal. No rashes or lesions Lymph nodes: cervical, supraclavicular, and axillary nodes normal. Neurologic: grossly normal  Pelvic: External genitalia:  no lesions              No abnormal inguinal nodes palpated.              Urethra:  normal appearing urethra with no masses, tenderness or lesions              Bartholins and Skenes: normal                 Vagina: normal appearing vagina with normal color and discharge, no lesions  Cervix: no lesions.  Mild bleeding noted.               Pap taken: No. Bimanual Exam:  Uterus:  normal size, contour, position, consistency, mobility, non-tender              Adnexa: no mass, fullness, tenderness      Chaperone was present for exam.  Assessment:   Well woman visit with normal exam. Hx HSV.   Plan: Mammogram screening discussed. Self breast awareness reviewed. Pap and HR HPV as above. Guidelines for Calcium, Vitamin D, regular exercise program including cardiovascular and weight bearing exercise. Declines Valtrex.  Refills of OCPs for one year.  Follow up annually and prn.  After visit summary provided.

## 2019-03-13 ENCOUNTER — Other Ambulatory Visit: Payer: Self-pay

## 2019-03-13 ENCOUNTER — Ambulatory Visit (INDEPENDENT_AMBULATORY_CARE_PROVIDER_SITE_OTHER): Payer: 59 | Admitting: Obstetrics and Gynecology

## 2019-03-13 ENCOUNTER — Encounter: Payer: Self-pay | Admitting: Obstetrics and Gynecology

## 2019-03-13 ENCOUNTER — Other Ambulatory Visit: Payer: Self-pay | Admitting: Obstetrics and Gynecology

## 2019-03-13 VITALS — BP 110/66 | HR 78 | Temp 97.1°F | Resp 14 | Ht 66.5 in | Wt 179.8 lb

## 2019-03-13 DIAGNOSIS — Z01419 Encounter for gynecological examination (general) (routine) without abnormal findings: Secondary | ICD-10-CM | POA: Diagnosis not present

## 2019-03-13 MED ORDER — LEVONORGEST-ETH ESTRAD 91-DAY 0.15-0.03 &0.01 MG PO TABS: 1.0000 | ORAL_TABLET | Freq: Every day | ORAL | 3 refills | Status: DC

## 2019-03-13 NOTE — Patient Instructions (Signed)

## 2019-03-16 ENCOUNTER — Encounter: Payer: Self-pay | Admitting: Family Medicine

## 2019-03-20 ENCOUNTER — Telehealth (HOSPITAL_COMMUNITY): Payer: Self-pay

## 2019-03-20 NOTE — Telephone Encounter (Signed)
The above patient or their representative was contacted and gave the following answers to these questions:         Do you have any of the following symptoms?    NO  Fever                    Cough                   Shortness of breath  Do  you have any of the following other symptoms?    muscle pain         vomiting,        diarrhea        rash         weakness        red eye        abdominal pain         bruising          bruising or bleeding              joint pain           severe headache    Have you been in contact with someone who was or has been sick in the past 2 weeks?  NO  Yes                 Unsure                         Unable to assess   Does the person that you were in contact with have any of the following symptoms?   Cough         shortness of breath           muscle pain         vomiting,            diarrhea            rash            weakness           fever            red eye           abdominal pain           bruising  or  bleeding                joint pain                severe headache                 COMMENTS OR ACTION PLAN FOR THIS PATIENT:        ALL QUESTION WERE ANSWERED/[facility] 

## 2019-03-21 ENCOUNTER — Encounter: Payer: Self-pay | Admitting: Vascular Surgery

## 2019-03-21 ENCOUNTER — Other Ambulatory Visit: Payer: Self-pay

## 2019-03-21 ENCOUNTER — Ambulatory Visit (INDEPENDENT_AMBULATORY_CARE_PROVIDER_SITE_OTHER): Payer: 59 | Admitting: Vascular Surgery

## 2019-03-21 VITALS — BP 113/75 | HR 84 | Temp 97.3°F | Resp 16 | Ht 66.0 in | Wt 177.0 lb

## 2019-03-21 DIAGNOSIS — I83893 Varicose veins of bilateral lower extremities with other complications: Secondary | ICD-10-CM

## 2019-03-21 HISTORY — PX: ENDOVENOUS ABLATION SAPHENOUS VEIN W/ LASER: SUR449

## 2019-03-21 NOTE — Progress Notes (Signed)
     Laser Ablation Procedure    Date: 03/21/2019   [facility] DOB:01-03-1986  Consent signed: Yes    Surgeon: Cari Caraway MD   Procedure: Laser Ablation: left Greater Saphenous Vein  BP 113/75 (BP Location: Left Arm, Patient Position: Sitting, Cuff Size: Normal)   Pulse 84   Temp (!) 97.3 F (36.3 C) (Temporal)   Resp 16   Ht 5\' 6"  (1.676 m)   Wt 177 lb (80.3 kg)   LMP 03/10/2019 (Exact Date)   SpO2 99%   BMI 28.57 kg/m   Tumescent Anesthesia: 400 cc 0.9% NaCl with 50 cc Lidocaine HCL 1%  and 15 cc 8.4% NaHCO3  Local Anesthesia: 6 cc Lidocaine HCL and NaHCO3 (ratio 2:1)  15 watts continuous mode        Total energy: 811 Joules   Total time: 0:53  Laser Fiber Ref. # 03/12/2019      Lot # [id number]   Stab Phlebectomy: 10-20 Sites: Thigh and Calf  Patient tolerated procedure well  Notes: Patient wore face mask.  All staff members wore facial masks and facial shields/goggles.    Description of Procedure:  After marking the course of the secondary varicosities, the patient was placed on the operating table in the supine position, and the left leg was prepped and draped in sterile fashion.   Local anesthetic was administered and under ultrasound guidance the saphenous vein was accessed with a micro needle and guide wire; then the mirco puncture sheath was placed.  A guide wire was inserted saphenofemoral junction , followed by a 5 french sheath.  The position of the sheath and then the laser fiber below the junction was confirmed using the ultrasound.  Tumescent anesthesia was administered along the course of the saphenous vein using ultrasound guidance. The patient was placed in Trendelenburg position and protective laser glasses were placed on patient and staff, and the laser was fired at 15 watts continuous mode advancing 1-35mm/second for a total of 811 joules.   For stab phlebectomies, local anesthetic was administered at the previously marked varicosities, and  tumescent anesthesia was administered around the vessels.  Ten to 20 stab wounds were made using the tip of an 11 blade. And using the vein hook, the phlebectomies were performed using a hemostat to avulse the varicosities.  Adequate hemostasis was achieved.     Steri strips were applied to the stab wounds and ABD pads and thigh high compression stockings were applied.  Ace wrap bandages were applied over the phlebectomy sites and at the top of the saphenofemoral junction. Blood loss was less than 15 cc.  Discharge instructions reviewed with patient and hardcopy of discharge instructions given to patient to take home. The patient ambulated out of the operating room having tolerated the procedure well.

## 2019-03-21 NOTE — Progress Notes (Signed)
Patient name: Emily Dudley MRN: [id number] DOB: [birth year] Sex: female  REASON FOR VISIT: For endovenous laser ablation of the left great saphenous vein with 10-20 stab phlebectomies.  HPI: Emily Dudley is a 34 y.o. female who I saw on 01/24/2019.  She had previously been seen by Dr. Chestine Spore.  She was having aching pain and burning in both legs and had significant varicose veins.  She had tried conservative treatment including thigh-high compression stockings with a gradient of 20 to 30 mmHg, leg elevation, and ibuprofen as needed for pain.  She had CEAP C2 venous disease.  She had not had any previous venous interventions and had no history of DVT.  She had a cluster of varicose veins in the medial left thigh and lateral leg.  I looked at the left great saphenous vein myself with the SonoSite.  She had reflux from the saphenofemoral junction to the calf.  The vein was significantly dilated to the level of the knee.  It did narrow down some at the level of the cluster of varicose veins in her medial thigh.  I thought she was a good candidate for laser ablation of the left great saphenous vein with 10-20 stabs.  I plan was to see her back 6 months after that to see how her symptoms on the right side were.  Her symptoms on the right were more tolerable and our plan was to continue with conservative treatment for the time being.  Current Outpatient Medications  Medication Sig Dispense Refill  . cetirizine (ZYRTEC) 10 MG tablet TAKE ONE TABLET BY MOUTH ONCE DAILY    . Levonorgestrel-Ethinyl Estradiol (AMETHIA) 0.15-0.03 &0.01 MG tablet Take 1 tablet by mouth daily. 3 Package 3  . valACYclovir (VALTREX) 1000 MG tablet Take 1 tablet (1,000 mg total) by mouth as needed. (Patient not taking: Reported on 01/24/2019) 30 tablet 0   No current facility-administered medications for this visit.    PHYSICAL EXAM: Vitals:   03/21/19 1058  BP: 113/75  Pulse: 84  Resp: 16  Temp: (!) 97.3 F (36.3 C)   TempSrc: Temporal  SpO2: 99%  Weight: 177 lb (80.3 kg)  Height: 5\' 6"  (1.676 m)    MEDICAL ISSUES:  LASER ABLATION LEFT GREAT SAPHENOUS VEIN/10-20 STABS: The patient was taken to the exam room and the dilated veins were marked with the patient standing.  The patient was then placed supine.  I looked at the left great saphenous vein myself with the SonoSite and felt that we could cannulate it just above the knee.  The left leg was prepped and draped in usual sterile fashion.  After the skin was anesthetized with 1% lidocaine I cannulated the great saphenous vein just above the knee with a micropuncture needle under ultrasound guidance.  Micropuncture sheath was introduced over a wire.  The J-wire would not advance through the vein so I used the straight end of the wire and was able to advance it to the saphenofemoral junction.  However the sheath would not advance over the wire.  I was able to advance the dilator alone.  We tried warming the leg but still were unable to pass the dilator and sheath.  Ultimately had to cannulate the vein higher up where the vein was slightly larger.  I cannulated the vein in the proximal thigh and the wire was introduced under ultrasound guidance to just below the saphenofemoral junction.  The sheath was advanced over the wire and then the dilator removed.  The  wire was then removed and the laser fiber positioned 2-1/2 to 3 cm from the saphenofemoral junction.  The patient was placed in Trendelenburg.  Tumescent anesthesia was administered circumferentially around the vein.  Laser ablation was performed of the proximal left great saphenous vein in the proximal thigh.  811 J of energy were used.  Next attention was turned to the marked varicosities.  Tumescent anesthesia was administered.  Using small stab incisions approximately 15-20 small stab incisions were made.  The vein was hoped and brought above the skin and bluntly excised using hemostats.  Pressure was held for  hemostasis.  Deitra Mayo [facility] [place] [phone]

## 2019-04-02 ENCOUNTER — Other Ambulatory Visit: Payer: Self-pay

## 2019-04-03 ENCOUNTER — Ambulatory Visit: Payer: 59 | Admitting: Family Medicine

## 2019-04-03 ENCOUNTER — Encounter: Payer: Self-pay | Admitting: Family Medicine

## 2019-04-03 VITALS — BP 98/68 | HR 78 | Temp 97.7°F | Wt 178.0 lb

## 2019-04-03 DIAGNOSIS — J3089 Other allergic rhinitis: Secondary | ICD-10-CM

## 2019-04-03 DIAGNOSIS — Z8249 Family history of ischemic heart disease and other diseases of the circulatory system: Secondary | ICD-10-CM | POA: Diagnosis not present

## 2019-04-03 NOTE — Progress Notes (Signed)
Subjective:    Patient ID: Emily Dudley, female    DOB: 19-Apr-1985, 34 y.o.   MRN: [id number]  No chief complaint on file.   HPI Patient was seen today for ongoing concerns.  Pt notes a family h/o aortic aneurysm on Dad's side.  Pt's dad s/p repair several years ago.  Pt's 9 yo cousin recently died after aortic aneurysm rupture while doing a dead-lift.  Pt would like to f/u with Cardiology at the urging of her mother.  Pt denies CP, HTN, dizziness, HAs, SOB, abd pain.   Pt also requesting referral to Allergies.  Has a h/o seasonal allergies with symptoms in Spring and Fall, will take OTC zyrtec.  Pt had allergy shots while in [place].  Notes h/o allergy to cats.  Past Medical History:  Diagnosis Date  . HSV infection   . Varicose veins of legs     No Known Allergies  ROS General: Denies fever, chills, night sweats, changes in weight, changes in appetite  +seasonal allergies HEENT: Denies headaches, ear pain, changes in vision, rhinorrhea, sore throat CV: Denies CP, palpitations, SOB, orthopnea Pulm: Denies SOB, cough, wheezing GI: Denies abdominal pain, nausea, vomiting, diarrhea, constipation GU: Denies dysuria, hematuria, frequency, vaginal discharge Msk: Denies muscle cramps, joint pains Neuro: Denies weakness, numbness, tingling Skin: Denies rashes, bruising Psych: Denies depression, anxiety, hallucinations      Objective:    Blood pressure 98/68, pulse 78, temperature 97.7 F (36.5 C), temperature source Temporal, weight 178 lb (80.7 kg), last menstrual period 03/10/2019, SpO2 99 %.   Gen. Pleasant, well-nourished, in no distress, normal affect  HEENT: [place]/AT, face symmetric, conjunctiva clear, no scleral icterus, PERRLA, EOMI, nares patent without drainage Neck: No JVD, no thyromegaly Lungs: no accessory muscle use, CTAB, no wheezes or rales Cardiovascular: RRR, no m/r/g, no peripheral edema Abdomen: BS present, soft, NT/ND, no hepatosplenomegaly. No widened/increased  pulsation of thoracic aorta Neuro:  A&Ox3, CN II-XII intact, normal gait  Wt Readings from Last 3 Encounters:  04/03/19 178 lb (80.7 kg)  03/21/19 177 lb (80.3 kg)  03/13/19 179 lb 12.8 oz (81.6 kg)    No results found for: WBC, HGB, HCT, PLT, GLUCOSE, CHOL, TRIG, HDL, LDLDIRECT, LDLCALC, ALT, AST, NA, K, CL, CREATININE, BUN, CO2, TSH, PSA, INR, GLUF, HGBA1C, MICROALBUR  Assessment/Plan:  Family history of aortic aneurysm  -pt asymptomatic -discussed screening options: CT -given handout - Plan: Ambulatory referral to Cardiology  Environmental and seasonal allergies  -continue Zyrtec OTC.  Consider nasal saline rinse. - Plan: Ambulatory referral to Allergy  F/u prn  Abbe Amsterdam, MD

## 2019-04-03 NOTE — Patient Instructions (Signed)
Allergies, Adult An allergy is when your body's defense system (immune system) overreacts to an otherwise harmless substance (allergen) that you breathe in or eat or something that touches your skin. When you come into contact with something that you are allergic to, your immune system produces certain proteins (antibodies). These proteins cause cells to release chemicals (histamines) that trigger the symptoms of an allergic reaction. Allergies often affect the nasal passages (allergic rhinitis), eyes (allergic conjunctivitis), skin (atopic dermatitis), and stomach. Allergies can be mild or severe. Allergies cannot spread from person to person (are not contagious). They can develop at any age and may be outgrown. What increases the risk? You may be at greater risk of allergies if other people in your family have allergies. What are the signs or symptoms? Symptoms depend on what type of allergy you have. They may include:  Runny, stuffy nose.  Sneezing.  Itchy mouth, ears, or throat.  Postnasal drip.  Sore throat.  Itchy, red, watery, or puffy eyes.  Skin rash or hives.  Stomach pain.  Vomiting.  Diarrhea.  Bloating.  Wheezing or coughing. People with a severe allergy to food, medicine, or an insect bite may have a life-threatening allergic reaction (anaphylaxis). Symptoms of anaphylaxis include:  Hives.  Itching.  Flushed face.  Swollen lips, tongue, or mouth.  Tight or swollen throat.  Chest pain or tightness in the chest.  Trouble breathing or shortness of breath.  Rapid heartbeat.  Dizziness or fainting.  Vomiting.  Diarrhea.  Pain in the abdomen. How is this diagnosed? This condition is diagnosed based on:  Your symptoms.  Your family and medical history.  A physical exam. You may need to see a health care provider who specializes in treating allergies (allergist). You may also have tests, including:  Skin tests to see which allergens are causing  your symptoms, such as: ? Skin prick test. In this test, your skin is pricked with a tiny needle and exposed to small amounts of possible allergens to see if your skin reacts. ? Intradermal skin test. In this test, a small amount of allergen is injected under your skin to see if your skin reacts. ? Patch test. In this test, a small amount of allergen is placed on your skin and then your skin is covered with a bandage. Your health care provider will check your skin after a couple of days to see if a rash has developed.  Blood tests.  Challenges tests. In this test, you inhale a small amount of allergen by mouth to see if you have an allergic reaction. You may also be asked to:  Keep a food diary. A food diary is a record of all the foods and drinks you have in a day and any symptoms you experience.  Practice an elimination diet. An elimination diet involves eliminating specific foods from your diet and then adding them back in one by one to find out if a certain food causes an allergic reaction. How is this treated? Treatment for allergies depends on your symptoms. Treatment may include:  Cold compresses to soothe itching and swelling.  Eye drops.  Nasal sprays.  Using a saline spray or container (neti pot) to flush out the nose (nasal irrigation). These methods can help clear away mucus and keep the nasal passages moist.  Using a humidifier.  Oral antihistamines or other medicines to block allergic reaction and inflammation.  Skin creams to treat rashes or itching.  Diet changes to eliminate food allergy triggers.    Repeated exposure to tiny amounts of allergens to build up a tolerance and prevent future allergic reactions (immunotherapy). These include: ? Allergy shots. ? Oral treatment. This involves taking small doses of an allergen under the tongue (sublingual immunotherapy).  Emergency epinephrine injection (auto-injector) in case of an allergic emergency. This is a  self-injectable, pre-measured medicine that must be given within the first few minutes of a serious allergic reaction. Follow these instructions at home:         Avoid known allergens whenever possible.  If you suffer from airborne allergens, wash out your nose daily. You can do this with a saline spray or a neti pot to flush out your nose (nasal irrigation).  Take over-the-counter and prescription medicines only as told by your health care provider.  Keep all follow-up visits as told by your health care provider. This is important.  If you are at risk of a severe allergic reaction (anaphylaxis), keep your auto-injector with you at all times.  If you have ever had anaphylaxis, wear a medical alert bracelet or necklace that states you have a severe allergy. Contact a health care provider if:  Your symptoms do not improve with treatment. Get help right away if:  You have symptoms of anaphylaxis, such as: ? Swollen mouth, tongue, or throat. ? Pain or tightness in your chest. ? Trouble breathing or shortness of breath. ? Dizziness or fainting. ? Severe abdominal pain, vomiting, or diarrhea. This information is not intended to replace advice given to you by your health care provider. Make sure you discuss any questions you have with your health care provider. Document Revised: 05/03/2017 Document Reviewed: 08/26/2015 Elsevier Patient Education  2020 Elsevier Inc.  Thoracic Aortic Aneurysm  An aneurysm is a bulge in an artery. It happens when blood pushes up against a weakened or damaged artery wall. A thoracic aortic aneurysm is an aneurysm that occurs in the first part of the aorta, between the heart and the diaphragm. The aorta is the main artery of the body. It supplies blood from the heart to the rest of the body. Some aneurysms may not cause symptoms or problems. However, a thoracic aortic aneurysm can cause two serious problems:  It can enlarge and burst (rupture).  It can  cause blood to flow between the layers of the wall of the aorta through a tear (aortic dissection). Both of these problems are medical emergencies. They can cause bleeding inside the body and can be life-threatening if they are not diagnosed and treated right away. What are the causes? The exact cause of this condition is not known. What increases the risk? The following factors may make you more likely to develop this condition:  Being 87 years of age or older.  Having a family history of aneurysms.  Using tobacco.  Having any of these conditions: ? Hardening of the arteries caused by the buildup of fat and other substances in the lining of a blood vessel (arteriosclerosis). ? Inflammation of the walls of an artery (arteritis). ? A genetic disease that weakens the body's connective tissue, such as Marfan syndrome. ? An injury or trauma to the aorta. ? High blood pressure (hypertension). ? High cholesterol. ? An infection from bacteria, such as syphilis or staphylococcus, in the wall of the aorta (infectious aortitis). What are the signs or symptoms? Symptoms of this condition vary depending on the size of the aneurysm and how fast it is growing. Most grow slowly and do not cause symptoms. When symptoms do  occur, they may include:  Pain in the chest, back, sides, or abdomen. The pain may vary in intensity. Sudden, severe pain may indicate that the aneurysm has ruptured.  Hoarseness.  Cough.  Shortness of breath.  Swallowing problems.  Swelling in the face, arms, or legs.  Fever.  Unexplained weight loss. How is this diagnosed? This condition may be diagnosed with:  An ultrasound.  X-rays.  CT scan.  MRI.  A test to check the arteries for damage or blockages (angiogram). Most unruptured thoracic aortic aneurysms cause no symptoms, so they are often found during exams for other conditions. How is this treated? Treatment for this condition depends on:  The size of  the aneurysm.  How fast the aneurysm is growing.  Your age.  Risk factors for rupture. Small aneurysms (2.2 inches, or 5.5 cm, or less) may be managed with:  Medicines to: ? Control blood pressure. ? Manage pain. ? Fight infection.  Regular monitoring. This may include an ultrasound or CT scan every year or every 6 months to see if the aneurysm is getting bigger. Large or fast-growing aneurysms may be treated with surgery. Follow these instructions at home: Eating and drinking   Eat a healthy diet. Your health care provider may recommend that you: ? Lower your salt (sodium) intake. In some people, too much salt can raise blood pressure and increase the risk for thoracic aortic aneurysm. ? Avoid foods that are high in saturated fat and cholesterol, such as red meat and full-fat dairy. ? Eat a diet that is low in sugar. ? Increase your fiber intake by including whole grains, vegetables, and fruits in your diet. Eating these foods may help to lower your blood pressure.  Do not drink alcohol if your health care provider tells you not to drink.  If you drink alcohol: ? Limit how much you use to:  0-1 drink a day for women.  0-2 drinks a day for men. ? Be aware of how much alcohol is in your drink. In the [place] one drink equals one 12 oz bottle of beer (355 mL), one 5 oz glass of wine (148 mL), or one 1 oz glass of hard liquor (44 mL). Lifestyle  Do not use any products that contain nicotine or tobacco, such as cigarettes, e-cigarettes, and chewing tobacco. If you need help quitting, ask your health care provider.  Maintain a healthy weight.  Check your blood pressure regularly. Follow your health care provider's instructions on how to keep your blood pressure within normal limits.  Have your blood sugar (glucose) level and cholesterol levels checked regularly. Follow your health care provider's instructions on how to keep levels within normal limits. Activity   Stay  physically active and exercise regularly. Talk with your health care provider about how often you should exercise and ask which types of exercise are best for you.  Avoid heavy lifting and activities that take a lot of effort. Ask your health care provider what activities are safe for you. General instructions  Take over-the-counter and prescription medicines only as told by your health care provider.  Talk with your health care provider about regular screenings to see if the aneurysm is getting bigger.  Keep all follow-up visits as told by your health care provider. This is important. Contact a health care provider if you have:  Unexplained weight loss. Get help right away if you have:  Pain in your upper back, neck, or abdomen. This pain may move into your chest and  arms.  Trouble swallowing.  A cough or hoarseness.  Shortness of breath. Summary  A thoracic aortic aneurysm is an aneurysm that occurs in the first part of the aorta, between the heart and the diaphragm.  As a thoracic aortic aneurysm becomes larger, it can burst (rupture), or blood can flow between the layers of the wall of the aorta through a tear (aorticdissection). These conditions can be life-threatening if they are not diagnosed and treated right away.  If you have a thoracic aortic aneurysm, its growth will be closely monitored. Surgical repair may be needed for larger or faster-growing aneurysms. This information is not intended to replace advice given to you by your health care provider. Make sure you discuss any questions you have with your health care provider. Document Revised: 09/26/2017 Document Reviewed: 09/27/2017 Elsevier Patient Education  [place].

## 2019-04-04 ENCOUNTER — Ambulatory Visit (INDEPENDENT_AMBULATORY_CARE_PROVIDER_SITE_OTHER): Payer: Self-pay | Admitting: Vascular Surgery

## 2019-04-04 ENCOUNTER — Other Ambulatory Visit: Payer: Self-pay

## 2019-04-04 ENCOUNTER — Encounter: Payer: Self-pay | Admitting: Vascular Surgery

## 2019-04-04 ENCOUNTER — Ambulatory Visit (HOSPITAL_COMMUNITY)
Admission: RE | Admit: 2019-04-04 | Discharge: 2019-04-04 | Disposition: A | Discharge status: Home / Self Care | Payer: 59 | Source: Ambulatory Visit | Attending: Vascular Surgery | Admitting: Vascular Surgery

## 2019-04-04 VITALS — BP 119/75 | HR 88 | Resp 18 | Ht 66.0 in | Wt 178.0 lb

## 2019-04-04 DIAGNOSIS — I83812 Varicose veins of left lower extremities with pain: Secondary | ICD-10-CM | POA: Insufficient documentation

## 2019-04-04 DIAGNOSIS — I872 Venous insufficiency (chronic) (peripheral): Secondary | ICD-10-CM

## 2019-04-04 DIAGNOSIS — I83893 Varicose veins of bilateral lower extremities with other complications: Secondary | ICD-10-CM

## 2019-04-04 NOTE — Progress Notes (Signed)
   Patient name: Emily Dudley MRN: [id number] DOB: 07/24/1985 Sex: female  REASON FOR VISIT: Follow-up after endovenous laser ablation of the left great saphenous vein  HPI: Kolbee Anwar Crill is a 34 y.o. female who underwent endovenous laser ablation of the left great saphenous vein with 10-20 stab phlebectomies on 03/21/2019.  Of note the vein was smaller in the midportion of the thigh and I was unable to pass the sheath.  This reason laser ablation was performed of the proximal thigh only.  SYMPTOMS: She had some pain from phlebitis when she went back to work after several days but the symptoms are much better now.  CONSERVATIVE TREATMENT: She continues to elevate her legs and use compression therapy during the day.  VENOUS INTERVENTIONS: She is now status post laser ablation of the left great saphenous vein with 10-20 stabs.  She said no procedures on the right side.  Current Outpatient Medications  Medication Sig Dispense Refill  . cetirizine (ZYRTEC) 10 MG tablet TAKE ONE TABLET BY MOUTH ONCE DAILY    . Levonorgestrel-Ethinyl Estradiol (AMETHIA) 0.15-0.03 &0.01 MG tablet Take 1 tablet by mouth daily. 3 Package 3  . valACYclovir (VALTREX) 1000 MG tablet Take 1 tablet (1,000 mg total) by mouth as needed. 30 tablet 0   No current facility-administered medications for this visit.   REVIEW OF SYSTEMS: Arly.Keller ] denotes positive finding; [  ] denotes negative finding  CARDIOVASCULAR:  [ ]  chest pain   [ ]  dyspnea on exertion  [ ]  leg swelling  CONSTITUTIONAL:  [ ]  fever   [ ]  chills  PHYSICAL EXAM: Vitals:   04/04/19 1036  BP: 119/75  Pulse: 88  Resp: 18  SpO2: (!) 10%  Weight: 178 lb (80.7 kg)  Height: 5\' 6"  (1.676 m)   GENERAL: The patient is a well-nourished female, in no acute distress. The vital signs are documented above. CARDIOVASCULAR: There is a regular rate and rhythm. PULMONARY: There is good air exchange bilaterally without wheezing or rales. VASCULAR: She has some  mild bruising in the distal thigh.  She has no significant leg swelling.  Her Steri-Strips are coming off.  DATA:  VENOUS DUPLEX: I have independently interpreted her venous duplex scan today.  There is no evidence of DVT in the left lower extremity.  The great saphenous vein was successfully closed from just below the saphenofemoral junction to the distal thigh.  MEDICAL ISSUES:  S/P LASER ABLATION LEFT GREAT SAPHENOUS VEIN WITH 10-20 STABS: The patient is doing well status post laser ablation of the left great saphenous vein with 10-20 stabs.  At this point she is off the ibuprofen.  She will go the knee-high compression stockings at work.  She works at the and is on her feet quite a bit.  [facility] [phone]

## 2019-04-15 ENCOUNTER — Other Ambulatory Visit: Payer: Self-pay

## 2019-04-15 ENCOUNTER — Telehealth: Payer: Self-pay | Admitting: Cardiology

## 2019-04-15 ENCOUNTER — Encounter: Payer: Self-pay | Admitting: Cardiology

## 2019-04-15 ENCOUNTER — Ambulatory Visit (INDEPENDENT_AMBULATORY_CARE_PROVIDER_SITE_OTHER): Payer: 59 | Admitting: Cardiology

## 2019-04-15 VITALS — BP 112/76 | HR 63 | Temp 97.2°F | Ht 66.0 in | Wt 176.0 lb

## 2019-04-15 DIAGNOSIS — Z8249 Family history of ischemic heart disease and other diseases of the circulatory system: Secondary | ICD-10-CM

## 2019-04-15 NOTE — Patient Instructions (Signed)
Medication Instructions:  Your Physician recommend you continue on your current medication as directed.    *If you need a refill on your cardiac medications before your next appointment, please call your pharmacy*  Lab Work: None  Testing/Procedures: MR Angio abdomen without contrast [place] [facility]     Follow-Up: At [facility], you and your health needs are our priority.  As part of our continuing mission to provide you with exceptional heart care, we have created designated Provider Care Teams.  These Care Teams include your primary Cardiologist (physician) and Advanced Practice Providers (APPs -  Physician Assistants and Nurse Practitioners) who all work together to provide you with the care you need, when you need it.  Your next appointment:   2 year(s)  The format for your next appointment:   In Person  Provider:   Jodelle Red, MD

## 2019-04-15 NOTE — Telephone Encounter (Signed)
Spoke with patient regarding appointment for MRA chest and abdomen scheduled  05/15/19 at 8:00 am at [facility]---arrival time is 7:30 am in Radiology.

## 2019-04-15 NOTE — Progress Notes (Signed)
Cardiology Office Note:    Date:  04/15/2019   ID:  Emily Dudley, DOB 1985/11/22, MRN [id number]  PCP:  Emily Saint, MD  Cardiologist:  Jodelle Red, MD  Referring MD: Emily Saint, MD   CC: new patient consultation for family history of aortic disease  History of Present Illness:    Emily Dudley is a 34 y.o. female with a hx of venous insufficiency s/p laser ablation who is seen as a new consult at the request of Emily Saint, MD for the evaluation and management of family history of aortic aneurysm.  I personally reviewed the note from Dr. Salomon Dudley dated 04/03/19. Per the note, there is a history of aortic aneurysm on her father's side, with her father having a repair several years ago. She also lost a 54 yo cousin who had an aortic aneurysm rupture while doing a dead lift weightlifting.   Father had surgery for TAA in 2008. He went to the hospital for something else, had a CT which showed the aneurysm, and then had surgery. No knowledge that aortic valve was abnormal. Cousin was dad's sister's son; dad's sister without issues as far as she knows. No genetic testing that she knows of.   On father's side: There are 4 siblings. Father, father's brother, father's mother, and father's sister's son all with history of TAA and/or rupture. No known CVA, aneurysm, MI. Varicose veins runs in the family.   No known history of Marfan's, EDS, Loeys-Dietz. No clinical features of connective tissue disease. No significant infectious disease history.  Denies chest pain, shortness of breath at rest or with normal exertion. No PND, orthopnea, LE edema or unexpected weight gain. No syncope or palpitations.  Past Medical History:  Diagnosis Date  . HSV infection   . Varicose veins of legs     Past Surgical History:  Procedure Laterality Date  . CHOLECYSTECTOMY    . ENDOVENOUS ABLATION SAPHENOUS VEIN W/ LASER Left 03/21/2019   endovenous laser ablation left greater  saphenous vein and stab phlebectomy 10-20 incisions left leg by Cari Caraway MD     Current Medications: Current Outpatient Medications on File Prior to Visit  Medication Sig  . cetirizine (ZYRTEC) 10 MG tablet TAKE ONE TABLET BY MOUTH ONCE DAILY  . Levonorgestrel-Ethinyl Estradiol (AMETHIA) 0.15-0.03 &0.01 MG tablet Take 1 tablet by mouth daily.  . valACYclovir (VALTREX) 1000 MG tablet Take 1 tablet (1,000 mg total) by mouth as needed.   No current facility-administered medications on file prior to visit.     Allergies:   Patient has no known allergies.   Social History   Tobacco Use  . Smoking status: Never Smoker  . Smokeless tobacco: Never Used  Substance Use Topics  . Alcohol use: Yes    Comment: socially  . Drug use: Never    Family History: family history includes Aortic aneurysm in her cousin and father; Cancer (age of onset: 86) in her maternal grandfather; Lung cancer in her maternal grandmother.  ROS:   Please see the history of present illness.  Additional pertinent ROS: Constitutional: Negative for chills, fever, night sweats, unintentional weight loss  HENT: Negative for ear pain and hearing loss.   Eyes: Negative for loss of vision and eye pain.  Respiratory: Negative for cough, sputum, wheezing.   Cardiovascular: See HPI. Gastrointestinal: Negative for abdominal pain, melena, and hematochezia.  Genitourinary: Negative for dysuria and hematuria.  Musculoskeletal: Negative for falls and myalgias.  Skin: Negative for itching and  rash.  Neurological: Negative for focal weakness, focal sensory changes and loss of consciousness.  Endo/Heme/Allergies: Does not bruise/bleed easily.     EKGs/Labs/Other Studies Reviewed:    The following studies were reviewed today: No prior cardiac studies  EKG:  EKG is personally reviewed.  The ekg ordered today demonstrates NSR  Recent Labs: No results found for requested labs within last 8760 hours.  Recent Lipid  Panel No results found for: CHOL, TRIG, HDL, CHOLHDL, VLDL, LDLCALC, LDLDIRECT  Physical Exam:    VS:  BP 112/76   Pulse 63   Temp (!) 97.2 F (36.2 C)   Ht 5\' 6"  (1.676 m)   Wt 176 lb (79.8 kg)   SpO2 100%   BMI 28.41 kg/m     Wt Readings from Last 3 Encounters:  04/15/19 176 lb (79.8 kg)  04/04/19 178 lb (80.7 kg)  04/03/19 178 lb (80.7 kg)    GEN: Well nourished, well developed in no acute distress HEENT: Normal, moist mucous membranes NECK: No JVD CARDIAC: regular rhythm, normal S1 and S2, no rubs or gallops. No murmurs. VASCULAR: Radial and DP pulses 2+ bilaterally. No carotid bruits RESPIRATORY:  Clear to auscultation without rales, wheezing or rhonchi  ABDOMEN: Soft, non-tender, non-distended MUSCULOSKELETAL:  Ambulates independently SKIN: Warm and dry, no edema NEUROLOGIC:  Alert and oriented x 3. No focal neuro deficits noted. PSYCHIATRIC:  Normal affect    ASSESSMENT:    1. Family history of thoracic aortic aneurysm   2. Family history of aortic dissection    PLAN:    Family history of thoracic aortic aneurysm, with aortic dissection in 9 year old cousin: strong family history on her father's side -no clear Marfan's, EDS, Loeys-Dietz or other connective tissue disease -we discussed genetic testing today. Most helpful would be to test her father, who has known disease. She will discuss this with him -I offered to refer to genetic services here (Dr. 41), and she will consider -has never been screened. With strong family history, would screen entire aorta. Ordered MR angio thoracic aorta to iliacs. If not covered, second choice would be CT angio.  -would also like to determine if aortic valve is bicuspid. No clear history of this  Plan for follow up: if imaging normal, follow up every 2 years.  Jomarie Longs, MD, PhD [place]  [facility]    Medication Adjustments/Labs and Tests Ordered: Current medicines are reviewed at length with the  patient today.  Concerns regarding medicines are outlined above.  Orders Placed This Encounter  Procedures  . MR ANGIO CHEST WO CONTRAST  . MR ANGIO ABDOMEN WO CONTRAST  . EKG 12-Lead   No orders of the defined types were placed in this encounter.   Patient Instructions  Medication Instructions:  Your Physician recommend you continue on your current medication as directed.    *If you need a refill on your cardiac medications before your next appointment, please call your pharmacy*  Lab Work: None  Testing/Procedures: MR Angio abdomen without contrast [place] [facility]     Follow-Up: At [facility], you and your health needs are our priority.  As part of our continuing mission to provide you with exceptional heart care, we have created designated Provider Care Teams.  These Care Teams include your primary Cardiologist (physician) and Advanced Practice Providers (APPs -  Physician Assistants and Nurse Practitioners) who all work together to provide you with the care you need, when you need it.  Your next appointment:  2 year(s)  The format for your next appointment:   In Person  Provider:   Buford Dresser, MD     Signed, Buford Dresser, MD PhD 04/15/2019 7:22 PM    [place]

## 2019-04-16 DIAGNOSIS — J3081 Allergic rhinitis due to animal (cat) (dog) hair and dander: Secondary | ICD-10-CM | POA: Diagnosis not present

## 2019-04-16 DIAGNOSIS — J3089 Other allergic rhinitis: Secondary | ICD-10-CM | POA: Diagnosis not present

## 2019-04-16 DIAGNOSIS — H1045 Other chronic allergic conjunctivitis: Secondary | ICD-10-CM | POA: Diagnosis not present

## 2019-04-16 DIAGNOSIS — J301 Allergic rhinitis due to pollen: Secondary | ICD-10-CM | POA: Diagnosis not present

## 2019-04-18 DIAGNOSIS — J3081 Allergic rhinitis due to animal (cat) (dog) hair and dander: Secondary | ICD-10-CM | POA: Diagnosis not present

## 2019-04-18 DIAGNOSIS — J301 Allergic rhinitis due to pollen: Secondary | ICD-10-CM | POA: Diagnosis not present

## 2019-04-19 DIAGNOSIS — J3089 Other allergic rhinitis: Secondary | ICD-10-CM | POA: Diagnosis not present

## 2019-04-29 DIAGNOSIS — J3081 Allergic rhinitis due to animal (cat) (dog) hair and dander: Secondary | ICD-10-CM | POA: Diagnosis not present

## 2019-04-29 DIAGNOSIS — J3089 Other allergic rhinitis: Secondary | ICD-10-CM | POA: Diagnosis not present

## 2019-04-29 DIAGNOSIS — J301 Allergic rhinitis due to pollen: Secondary | ICD-10-CM | POA: Diagnosis not present

## 2019-05-01 DIAGNOSIS — J3089 Other allergic rhinitis: Secondary | ICD-10-CM | POA: Diagnosis not present

## 2019-05-01 DIAGNOSIS — J301 Allergic rhinitis due to pollen: Secondary | ICD-10-CM | POA: Diagnosis not present

## 2019-05-01 DIAGNOSIS — J3081 Allergic rhinitis due to animal (cat) (dog) hair and dander: Secondary | ICD-10-CM | POA: Diagnosis not present

## 2019-05-03 DIAGNOSIS — J3089 Other allergic rhinitis: Secondary | ICD-10-CM | POA: Diagnosis not present

## 2019-05-03 DIAGNOSIS — J301 Allergic rhinitis due to pollen: Secondary | ICD-10-CM | POA: Diagnosis not present

## 2019-05-03 DIAGNOSIS — J3081 Allergic rhinitis due to animal (cat) (dog) hair and dander: Secondary | ICD-10-CM | POA: Diagnosis not present

## 2019-05-06 DIAGNOSIS — J3081 Allergic rhinitis due to animal (cat) (dog) hair and dander: Secondary | ICD-10-CM | POA: Diagnosis not present

## 2019-05-06 DIAGNOSIS — J3089 Other allergic rhinitis: Secondary | ICD-10-CM | POA: Diagnosis not present

## 2019-05-06 DIAGNOSIS — J301 Allergic rhinitis due to pollen: Secondary | ICD-10-CM | POA: Diagnosis not present

## 2019-05-08 DIAGNOSIS — J301 Allergic rhinitis due to pollen: Secondary | ICD-10-CM | POA: Diagnosis not present

## 2019-05-08 DIAGNOSIS — J3089 Other allergic rhinitis: Secondary | ICD-10-CM | POA: Diagnosis not present

## 2019-05-08 DIAGNOSIS — J3081 Allergic rhinitis due to animal (cat) (dog) hair and dander: Secondary | ICD-10-CM | POA: Diagnosis not present

## 2019-05-10 DIAGNOSIS — J3089 Other allergic rhinitis: Secondary | ICD-10-CM | POA: Diagnosis not present

## 2019-05-10 DIAGNOSIS — J3081 Allergic rhinitis due to animal (cat) (dog) hair and dander: Secondary | ICD-10-CM | POA: Diagnosis not present

## 2019-05-10 DIAGNOSIS — J301 Allergic rhinitis due to pollen: Secondary | ICD-10-CM | POA: Diagnosis not present

## 2019-05-13 DIAGNOSIS — J3081 Allergic rhinitis due to animal (cat) (dog) hair and dander: Secondary | ICD-10-CM | POA: Diagnosis not present

## 2019-05-13 DIAGNOSIS — J301 Allergic rhinitis due to pollen: Secondary | ICD-10-CM | POA: Diagnosis not present

## 2019-05-13 DIAGNOSIS — J3089 Other allergic rhinitis: Secondary | ICD-10-CM | POA: Diagnosis not present

## 2019-05-15 ENCOUNTER — Ambulatory Visit (HOSPITAL_COMMUNITY)
Admission: RE | Admit: 2019-05-15 | Discharge: 2019-05-15 | Disposition: A | Discharge status: Home / Self Care | Payer: 59 | Source: Ambulatory Visit | Attending: Cardiology | Admitting: Cardiology

## 2019-05-15 ENCOUNTER — Encounter (HOSPITAL_COMMUNITY): Payer: Self-pay

## 2019-05-15 DIAGNOSIS — Z0389 Encounter for observation for other suspected diseases and conditions ruled out: Secondary | ICD-10-CM | POA: Diagnosis not present

## 2019-05-15 DIAGNOSIS — Z8249 Family history of ischemic heart disease and other diseases of the circulatory system: Secondary | ICD-10-CM

## 2019-05-15 DIAGNOSIS — J3089 Other allergic rhinitis: Secondary | ICD-10-CM | POA: Diagnosis not present

## 2019-05-15 DIAGNOSIS — J3081 Allergic rhinitis due to animal (cat) (dog) hair and dander: Secondary | ICD-10-CM | POA: Diagnosis not present

## 2019-05-15 DIAGNOSIS — J301 Allergic rhinitis due to pollen: Secondary | ICD-10-CM | POA: Diagnosis not present

## 2019-05-15 DIAGNOSIS — Z136 Encounter for screening for cardiovascular disorders: Secondary | ICD-10-CM | POA: Diagnosis not present

## 2019-05-17 DIAGNOSIS — J3089 Other allergic rhinitis: Secondary | ICD-10-CM | POA: Diagnosis not present

## 2019-05-17 DIAGNOSIS — J3081 Allergic rhinitis due to animal (cat) (dog) hair and dander: Secondary | ICD-10-CM | POA: Diagnosis not present

## 2019-05-17 DIAGNOSIS — J301 Allergic rhinitis due to pollen: Secondary | ICD-10-CM | POA: Diagnosis not present

## 2019-05-20 DIAGNOSIS — J301 Allergic rhinitis due to pollen: Secondary | ICD-10-CM | POA: Diagnosis not present

## 2019-05-20 DIAGNOSIS — J3089 Other allergic rhinitis: Secondary | ICD-10-CM | POA: Diagnosis not present

## 2019-05-23 DIAGNOSIS — J301 Allergic rhinitis due to pollen: Secondary | ICD-10-CM | POA: Diagnosis not present

## 2019-05-23 DIAGNOSIS — J3081 Allergic rhinitis due to animal (cat) (dog) hair and dander: Secondary | ICD-10-CM | POA: Diagnosis not present

## 2019-05-23 DIAGNOSIS — J3089 Other allergic rhinitis: Secondary | ICD-10-CM | POA: Diagnosis not present

## 2019-05-27 DIAGNOSIS — J3089 Other allergic rhinitis: Secondary | ICD-10-CM | POA: Diagnosis not present

## 2019-05-27 DIAGNOSIS — J301 Allergic rhinitis due to pollen: Secondary | ICD-10-CM | POA: Diagnosis not present

## 2019-05-27 DIAGNOSIS — J3081 Allergic rhinitis due to animal (cat) (dog) hair and dander: Secondary | ICD-10-CM | POA: Diagnosis not present

## 2019-05-29 DIAGNOSIS — J3089 Other allergic rhinitis: Secondary | ICD-10-CM | POA: Diagnosis not present

## 2019-05-29 DIAGNOSIS — J3081 Allergic rhinitis due to animal (cat) (dog) hair and dander: Secondary | ICD-10-CM | POA: Diagnosis not present

## 2019-05-29 DIAGNOSIS — J301 Allergic rhinitis due to pollen: Secondary | ICD-10-CM | POA: Diagnosis not present

## 2019-05-30 MED FILL — ASHLYNA 0.15-0.03-0.01 MG T: 0.15-0.03 & | 91 days supply | Qty: 91 | Fill #0

## 2019-05-31 DIAGNOSIS — J3089 Other allergic rhinitis: Secondary | ICD-10-CM | POA: Diagnosis not present

## 2019-05-31 DIAGNOSIS — J301 Allergic rhinitis due to pollen: Secondary | ICD-10-CM | POA: Diagnosis not present

## 2019-05-31 DIAGNOSIS — J3081 Allergic rhinitis due to animal (cat) (dog) hair and dander: Secondary | ICD-10-CM | POA: Diagnosis not present

## 2019-06-03 DIAGNOSIS — J3089 Other allergic rhinitis: Secondary | ICD-10-CM | POA: Diagnosis not present

## 2019-06-03 DIAGNOSIS — J301 Allergic rhinitis due to pollen: Secondary | ICD-10-CM | POA: Diagnosis not present

## 2019-06-03 DIAGNOSIS — J3081 Allergic rhinitis due to animal (cat) (dog) hair and dander: Secondary | ICD-10-CM | POA: Diagnosis not present

## 2019-06-05 DIAGNOSIS — J3089 Other allergic rhinitis: Secondary | ICD-10-CM | POA: Diagnosis not present

## 2019-06-05 DIAGNOSIS — J301 Allergic rhinitis due to pollen: Secondary | ICD-10-CM | POA: Diagnosis not present

## 2019-06-05 DIAGNOSIS — J3081 Allergic rhinitis due to animal (cat) (dog) hair and dander: Secondary | ICD-10-CM | POA: Diagnosis not present

## 2019-06-10 DIAGNOSIS — J301 Allergic rhinitis due to pollen: Secondary | ICD-10-CM | POA: Diagnosis not present

## 2019-06-10 DIAGNOSIS — J3081 Allergic rhinitis due to animal (cat) (dog) hair and dander: Secondary | ICD-10-CM | POA: Diagnosis not present

## 2019-06-10 DIAGNOSIS — J3089 Other allergic rhinitis: Secondary | ICD-10-CM | POA: Diagnosis not present

## 2019-06-12 DIAGNOSIS — J3089 Other allergic rhinitis: Secondary | ICD-10-CM | POA: Diagnosis not present

## 2019-06-17 DIAGNOSIS — J3081 Allergic rhinitis due to animal (cat) (dog) hair and dander: Secondary | ICD-10-CM | POA: Diagnosis not present

## 2019-06-17 DIAGNOSIS — J301 Allergic rhinitis due to pollen: Secondary | ICD-10-CM | POA: Diagnosis not present

## 2019-06-17 DIAGNOSIS — J3089 Other allergic rhinitis: Secondary | ICD-10-CM | POA: Diagnosis not present

## 2019-06-19 DIAGNOSIS — J3089 Other allergic rhinitis: Secondary | ICD-10-CM | POA: Diagnosis not present

## 2019-06-19 DIAGNOSIS — J3081 Allergic rhinitis due to animal (cat) (dog) hair and dander: Secondary | ICD-10-CM | POA: Diagnosis not present

## 2019-06-19 DIAGNOSIS — J301 Allergic rhinitis due to pollen: Secondary | ICD-10-CM | POA: Diagnosis not present

## 2019-06-24 DIAGNOSIS — J3089 Other allergic rhinitis: Secondary | ICD-10-CM | POA: Diagnosis not present

## 2019-06-24 DIAGNOSIS — J301 Allergic rhinitis due to pollen: Secondary | ICD-10-CM | POA: Diagnosis not present

## 2019-06-24 DIAGNOSIS — J3081 Allergic rhinitis due to animal (cat) (dog) hair and dander: Secondary | ICD-10-CM | POA: Diagnosis not present

## 2019-06-25 ENCOUNTER — Other Ambulatory Visit: Payer: Self-pay

## 2019-06-26 ENCOUNTER — Ambulatory Visit: Payer: 59

## 2019-06-26 ENCOUNTER — Other Ambulatory Visit (INDEPENDENT_AMBULATORY_CARE_PROVIDER_SITE_OTHER): Payer: 59

## 2019-06-26 DIAGNOSIS — J3081 Allergic rhinitis due to animal (cat) (dog) hair and dander: Secondary | ICD-10-CM | POA: Diagnosis not present

## 2019-06-26 DIAGNOSIS — J3089 Other allergic rhinitis: Secondary | ICD-10-CM | POA: Diagnosis not present

## 2019-06-26 DIAGNOSIS — J301 Allergic rhinitis due to pollen: Secondary | ICD-10-CM | POA: Diagnosis not present

## 2019-06-26 DIAGNOSIS — Z111 Encounter for screening for respiratory tuberculosis: Secondary | ICD-10-CM

## 2019-06-28 LAB — QUANTIFERON-TB GOLD PLUS
Mitogen-NIL: 9.14 IU/mL
NIL: 0.02 IU/mL
QuantiFERON-TB Gold Plus: NEGATIVE
TB1-NIL: 0 IU/mL
TB2-NIL: 0 IU/mL

## 2019-07-01 DIAGNOSIS — J3089 Other allergic rhinitis: Secondary | ICD-10-CM | POA: Diagnosis not present

## 2019-07-01 DIAGNOSIS — J301 Allergic rhinitis due to pollen: Secondary | ICD-10-CM | POA: Diagnosis not present

## 2019-07-01 DIAGNOSIS — J3081 Allergic rhinitis due to animal (cat) (dog) hair and dander: Secondary | ICD-10-CM | POA: Diagnosis not present

## 2019-07-08 DIAGNOSIS — J301 Allergic rhinitis due to pollen: Secondary | ICD-10-CM | POA: Diagnosis not present

## 2019-07-08 DIAGNOSIS — J3081 Allergic rhinitis due to animal (cat) (dog) hair and dander: Secondary | ICD-10-CM | POA: Diagnosis not present

## 2019-07-08 DIAGNOSIS — J3089 Other allergic rhinitis: Secondary | ICD-10-CM | POA: Diagnosis not present

## 2019-07-09 DIAGNOSIS — J3089 Other allergic rhinitis: Secondary | ICD-10-CM | POA: Diagnosis not present

## 2019-07-15 DIAGNOSIS — J3089 Other allergic rhinitis: Secondary | ICD-10-CM | POA: Diagnosis not present

## 2019-07-15 DIAGNOSIS — J301 Allergic rhinitis due to pollen: Secondary | ICD-10-CM | POA: Diagnosis not present

## 2019-07-15 DIAGNOSIS — J3081 Allergic rhinitis due to animal (cat) (dog) hair and dander: Secondary | ICD-10-CM | POA: Diagnosis not present

## 2019-07-19 ENCOUNTER — Telehealth: Payer: 59 | Admitting: Nurse Practitioner

## 2019-07-19 DIAGNOSIS — S30860A Insect bite (nonvenomous) of lower back and pelvis, initial encounter: Secondary | ICD-10-CM

## 2019-07-19 DIAGNOSIS — W57XXXA Bitten or stung by nonvenomous insect and other nonvenomous arthropods, initial encounter: Secondary | ICD-10-CM

## 2019-07-19 MED ORDER — DOXYCYCLINE HYCLATE 100 MG PO TABS: 200.0000 mg | ORAL_TABLET | Freq: Once | ORAL | 0 refills | Status: AC

## 2019-07-19 MED FILL — DOXYCYCLINE HYCLATE 100 MG: 100 | 1 days supply | Qty: 2 | Fill #0

## 2019-07-19 NOTE — Progress Notes (Signed)
Thank you for describing your tick bite, Here is how we plan to help! Based on the information that you shared with me it looks like you have A tick that bite that we will treat with a short course of doxycycline.  In most cases a tick bite is painless and does not itch.  Most tick bites in which the tick is quickly removed do not require prescriptions. Ticks can transmit several diseases if they are infected and remain attacked to your skin. Therefore the length that the tick was attached and any symptoms you have experienced after the bite are import to accurately develop your custom treatment plan. In most cases a single dose of doxycycline may prevent the development of a more serious condition.  Based on your information I have Provided a home care guide for tick bites and  instructions on when to call for help. and I have sent a single dose of doxycycline to the pharmacy you selected. Please make sure that you selected a pharmacy that is open now.  Which ticks  are associated with illness?  The Wood Tick (dog tick) is the size of a watermelon seed and can sometimes transmit [facility] spotted fever and [place] tick fever.   The Deer Tick (black-legged tick) is between the size of a poppy seed (pin head) and an apple seed, and can sometimes transmit Lyme disease.  A brown to black tick with a white splotch on its back is likely a female Amblyomma americanum ([place] tick). This tick has been associated with Southern Tick Associated illness ( STARI)  Lyme disease has become the most common tick-borne illness in the [place]. The risk of Lyme disease following a recognized deer tick bite is estimated to be 1%.  The majority of cases of Lyme disease start with a bull's eye rash at the site of the tick bite. The rash can occur days to weeks (typically 7-10 days) after a tick bite. Treatment with antibiotics is indicated if this rash appears. Flu-like symptoms may accompany the rash,  including: fever, chills, headaches, muscle aches, and fatigue. Removing ticks promptly may prevent tick borne disease.  What can be used to prevent Tick Bites?   Insect repellant with at leas 20% DEET.  Wearing long pants with sock and shoes.  Avoiding tall grass and heavily wooded areas.  Checking your skin after being outdoors.  Shower with a washcloth after outdoor exposures.  HOME CARE ADVICE FOR TICK BITE  1. Wood Tick Removal:  o Use a pair of tweezers and grasp the wood tick close to the skin (on its head). Pull the wood tick straight upward without twisting or crushing it. Maintain a steady pressure until it releases its grip.   o If tweezers aren't available, use fingers, a loop of thread around the jaws, or a needle between the jaws for traction.  o Note: covering the tick with petroleum jelly, nail polish or rubbing alcohol doesn't work. Neither does touching the tick with a hot or cold object. 2. Tiny Deer Tick Removal:   o Needs to be scraped off with a knife blade or credit card edge. o Place tick in a sealed container (e.g. glass jar, zip lock plastic bag), in case your doctor wants to see it. 3. Tick's Head Removal:  o If the wood tick's head breaks off in the skin, it must be removed. Clean the skin. Then use a sterile needle to uncover the head and lift it out or  scrape it off.  o If a very small piece of the head remains, the skin will eventually slough it off. 4. Antibiotic Ointment:  o Wash the wound and your hands with soap and water after removal to prevent catching any tick disease.  Apply an over the counter antibiotic ointment (e.g. bacitracin) to the bite once. 5. Expected Course: Tick bites normally don't itch or hurt. That's why they often go unnoticed. 6. Call Your Doctor If:  o You can't remove the tick or the tick's head o Fever, a severe head ache, or rash occur in the next 2 weeks o Bite begins to look infected o Lyme's disease is common in your  area o You have not had a tetanus in the last 10 years o Your current symptoms become worse    MAKE SURE YOU   Understand these instructions.  Will watch your condition.  Will get help right away if you are not doing well or get worse.   Thank you for choosing an e-visit.  Your e-visit answers were reviewed by a board certified advanced clinical practitioner to complete your personal care plan. Depending upon the condition, your plan could have included both over the counter or prescription medications. Please review your pharmacy choice. If there is a problem you may use MyChart messaging to have the prescription routed to another pharmacy. Your safety is important to us. If you have drug allergies check your prescription carefully.   You can use MyChart to ask questions about today's visit, request a non-urgent call back, or ask for a work or school excuse for 24 hours related to this e-Visit. If it has been greater than 24 hours you will need to follow up with your provider, or enter a new e-Visit to address those concerns.  You will get an email in the next two days asking about your experience. I hope  that your e-visit has been valuable and will speed your recovery   I have spent at least 5 minutes reviewing and documenting in the patient's chart. 

## 2019-07-23 DIAGNOSIS — J3081 Allergic rhinitis due to animal (cat) (dog) hair and dander: Secondary | ICD-10-CM | POA: Diagnosis not present

## 2019-07-23 DIAGNOSIS — J301 Allergic rhinitis due to pollen: Secondary | ICD-10-CM | POA: Diagnosis not present

## 2019-07-23 DIAGNOSIS — J3089 Other allergic rhinitis: Secondary | ICD-10-CM | POA: Diagnosis not present

## 2019-07-25 DIAGNOSIS — J301 Allergic rhinitis due to pollen: Secondary | ICD-10-CM | POA: Diagnosis not present

## 2019-07-25 DIAGNOSIS — J3081 Allergic rhinitis due to animal (cat) (dog) hair and dander: Secondary | ICD-10-CM | POA: Diagnosis not present

## 2019-07-29 DIAGNOSIS — J301 Allergic rhinitis due to pollen: Secondary | ICD-10-CM | POA: Diagnosis not present

## 2019-07-29 DIAGNOSIS — J3081 Allergic rhinitis due to animal (cat) (dog) hair and dander: Secondary | ICD-10-CM | POA: Diagnosis not present

## 2019-07-29 DIAGNOSIS — J3089 Other allergic rhinitis: Secondary | ICD-10-CM | POA: Diagnosis not present

## 2019-07-31 DIAGNOSIS — J3089 Other allergic rhinitis: Secondary | ICD-10-CM | POA: Diagnosis not present

## 2019-07-31 DIAGNOSIS — J301 Allergic rhinitis due to pollen: Secondary | ICD-10-CM | POA: Diagnosis not present

## 2019-08-05 DIAGNOSIS — J3081 Allergic rhinitis due to animal (cat) (dog) hair and dander: Secondary | ICD-10-CM | POA: Diagnosis not present

## 2019-08-05 DIAGNOSIS — J301 Allergic rhinitis due to pollen: Secondary | ICD-10-CM | POA: Diagnosis not present

## 2019-08-05 DIAGNOSIS — J3089 Other allergic rhinitis: Secondary | ICD-10-CM | POA: Diagnosis not present

## 2019-08-07 DIAGNOSIS — J3089 Other allergic rhinitis: Secondary | ICD-10-CM | POA: Diagnosis not present

## 2019-08-07 DIAGNOSIS — J3081 Allergic rhinitis due to animal (cat) (dog) hair and dander: Secondary | ICD-10-CM | POA: Diagnosis not present

## 2019-08-07 DIAGNOSIS — J301 Allergic rhinitis due to pollen: Secondary | ICD-10-CM | POA: Diagnosis not present

## 2019-08-12 DIAGNOSIS — J301 Allergic rhinitis due to pollen: Secondary | ICD-10-CM | POA: Diagnosis not present

## 2019-08-12 DIAGNOSIS — J3089 Other allergic rhinitis: Secondary | ICD-10-CM | POA: Diagnosis not present

## 2019-08-12 DIAGNOSIS — J3081 Allergic rhinitis due to animal (cat) (dog) hair and dander: Secondary | ICD-10-CM | POA: Diagnosis not present

## 2019-08-14 DIAGNOSIS — J3089 Other allergic rhinitis: Secondary | ICD-10-CM | POA: Diagnosis not present

## 2019-08-14 DIAGNOSIS — J301 Allergic rhinitis due to pollen: Secondary | ICD-10-CM | POA: Diagnosis not present

## 2019-08-14 DIAGNOSIS — J3081 Allergic rhinitis due to animal (cat) (dog) hair and dander: Secondary | ICD-10-CM | POA: Diagnosis not present

## 2019-08-19 ENCOUNTER — Ambulatory Visit: Payer: 59 | Attending: Internal Medicine

## 2019-08-19 ENCOUNTER — Other Ambulatory Visit: Payer: 59

## 2019-08-19 DIAGNOSIS — Z20822 Contact with and (suspected) exposure to covid-19: Secondary | ICD-10-CM

## 2019-08-20 DIAGNOSIS — J3089 Other allergic rhinitis: Secondary | ICD-10-CM | POA: Diagnosis not present

## 2019-08-20 DIAGNOSIS — J301 Allergic rhinitis due to pollen: Secondary | ICD-10-CM | POA: Diagnosis not present

## 2019-08-20 DIAGNOSIS — J3081 Allergic rhinitis due to animal (cat) (dog) hair and dander: Secondary | ICD-10-CM | POA: Diagnosis not present

## 2019-08-20 LAB — NOVEL CORONAVIRUS, NAA: SARS-CoV-2, NAA: NOT DETECTED

## 2019-08-20 LAB — SARS-COV-2, NAA 2 DAY TAT

## 2019-08-22 DIAGNOSIS — J3081 Allergic rhinitis due to animal (cat) (dog) hair and dander: Secondary | ICD-10-CM | POA: Diagnosis not present

## 2019-08-22 DIAGNOSIS — J301 Allergic rhinitis due to pollen: Secondary | ICD-10-CM | POA: Diagnosis not present

## 2019-08-27 DIAGNOSIS — J3089 Other allergic rhinitis: Secondary | ICD-10-CM | POA: Diagnosis not present

## 2019-08-27 DIAGNOSIS — J301 Allergic rhinitis due to pollen: Secondary | ICD-10-CM | POA: Diagnosis not present

## 2019-08-27 DIAGNOSIS — J3081 Allergic rhinitis due to animal (cat) (dog) hair and dander: Secondary | ICD-10-CM | POA: Diagnosis not present

## 2019-09-02 ENCOUNTER — Other Ambulatory Visit: Payer: Self-pay

## 2019-09-02 ENCOUNTER — Encounter: Payer: 59 | Admitting: Family Medicine

## 2019-09-02 DIAGNOSIS — J3081 Allergic rhinitis due to animal (cat) (dog) hair and dander: Secondary | ICD-10-CM | POA: Diagnosis not present

## 2019-09-02 DIAGNOSIS — J3089 Other allergic rhinitis: Secondary | ICD-10-CM | POA: Diagnosis not present

## 2019-09-02 DIAGNOSIS — J301 Allergic rhinitis due to pollen: Secondary | ICD-10-CM | POA: Diagnosis not present

## 2019-09-02 MED FILL — ASHLYNA 0.15-0.03-0.01 MG T: 0.15-0.03 & | 91 days supply | Qty: 91 | Fill #1

## 2019-09-10 DIAGNOSIS — J301 Allergic rhinitis due to pollen: Secondary | ICD-10-CM | POA: Diagnosis not present

## 2019-09-10 DIAGNOSIS — J3081 Allergic rhinitis due to animal (cat) (dog) hair and dander: Secondary | ICD-10-CM | POA: Diagnosis not present

## 2019-09-10 DIAGNOSIS — J3089 Other allergic rhinitis: Secondary | ICD-10-CM | POA: Diagnosis not present

## 2019-09-11 ENCOUNTER — Encounter: Payer: 59 | Admitting: Family Medicine

## 2019-09-12 ENCOUNTER — Encounter: Payer: 59 | Admitting: Family Medicine

## 2019-09-13 ENCOUNTER — Other Ambulatory Visit: Payer: Self-pay

## 2019-09-13 ENCOUNTER — Encounter: Payer: Self-pay | Admitting: Family Medicine

## 2019-09-13 ENCOUNTER — Ambulatory Visit (INDEPENDENT_AMBULATORY_CARE_PROVIDER_SITE_OTHER): Payer: 59 | Admitting: Family Medicine

## 2019-09-13 VITALS — BP 122/78 | HR 68 | Temp 98.3°F | Ht 66.0 in | Wt 165.4 lb

## 2019-09-13 DIAGNOSIS — Z13220 Encounter for screening for lipoid disorders: Secondary | ICD-10-CM

## 2019-09-13 DIAGNOSIS — Z131 Encounter for screening for diabetes mellitus: Secondary | ICD-10-CM | POA: Diagnosis not present

## 2019-09-13 DIAGNOSIS — Z0000 Encounter for general adult medical examination without abnormal findings: Secondary | ICD-10-CM

## 2019-09-13 NOTE — Progress Notes (Signed)
Subjective:     Emily Dudley is a 34 y.o. female and is here for a comprehensive physical exam. The patient reports no problems.  Patient states she has been doing well and feeling well overall.  Pap up-to-date.  Social History   Socioeconomic History   Marital status: Married    Spouse name: Not on file   Number of children: Not on file   Years of education: Not on file   Highest education level: Not on file  Occupational History   Not on file  Tobacco Use   Smoking status: Never Smoker   Smokeless tobacco: Never Used  Vaping Use   Vaping Use: Never used  Substance and Sexual Activity   Alcohol use: Yes    Comment: socially   Drug use: Never   Sexual activity: Yes    Birth control/protection: Pill    Comment: Amethia  Other Topics Concern   Not on file  Social History Narrative   Not on file   Social Determinants of Health   Financial Resource Strain:    Difficulty of Paying Living Expenses:   Food Insecurity:    Worried About Programme researcher, broadcasting/film/video in the Last Year:    Barista in the Last Year:   Transportation Needs:    Freight forwarder (Medical):    Lack of Transportation (Non-Medical):   Physical Activity:    Days of Exercise per Week:    Minutes of Exercise per Session:   Stress:    Feeling of Stress :   Social Connections:    Frequency of Communication with Friends and Family:    Frequency of Social Gatherings with Friends and Family:    Attends Religious Services:    Active Member of Clubs or Organizations:    Attends Engineer, structural:    Marital Status:   Intimate Partner Violence:    Fear of Current or Ex-Partner:    Emotionally Abused:    Physically Abused:    Sexually Abused:    Health Maintenance  Topic Date Due   Hepatitis C Screening  Never done   HIV Screening  Never done   INFLUENZA VACCINE  09/22/2019   PAP SMEAR-Modifier  03/07/2021   TETANUS/TDAP  06/11/2022     The following portions of the patient's history were reviewed and updated as appropriate: allergies, current medications, past family history, past medical history, past social history, past surgical history and problem list.  Review of Systems A comprehensive review of systems was negative.   Objective:    BP 122/78 (BP Location: Left Arm, Patient Position: Sitting, Cuff Size: Normal)    Pulse 68    Temp 98.3 F (36.8 C) (Oral)    Ht 5\' 6"  (1.676 m)    Wt 165 lb 6 oz (75 kg)    LMP 09/11/2019    SpO2 98%    BMI 26.69 kg/m  General appearance: alert, cooperative and no distress Head: Normocephalic, without obvious abnormality, atraumatic Eyes: conjunctivae/corneas clear. PERRL, EOM's intact. Fundi benign. Ears: normal TM's and external ear canals both ears Nose: Nares normal. Septum midline. Mucosa normal. No drainage or sinus tenderness. Throat: lips, mucosa, and tongue normal; teeth and gums normal Neck: no adenopathy, no carotid bruit, no JVD, supple, symmetrical, trachea midline and thyroid not enlarged, symmetric, no tenderness/mass/nodules Lungs: clear to auscultation bilaterally Heart: regular rate and rhythm, S1, S2 normal, no murmur, click, rub or gallop Abdomen: soft, non-tender; bowel sounds normal; no masses,  no organomegaly Extremities: extremities normal, atraumatic, no cyanosis or edema Pulses: 2+ and symmetric Skin: Skin color, texture, turgor normal. No rashes or lesions Lymph nodes: Cervical, supraclavicular, and axillary nodes normal. Neurologic: Alert and oriented X 3, normal strength and tone. Normal symmetric reflexes. Normal coordination and gait    Assessment:    Healthy female exam.      Plan:     Anticipatory guidance given including wearing seatbelts, smoke detectors in the home, increasing physical activity, increasing p.o. intake of water and vegetables. -PHQ-9 score 2, GAD-7 score 1. -We will obtain labs -Given handout -Pap up-to-date.  Due to  date 03/31/2020 -Next CPE in 1 year See After Visit Summary for Counseling Recommendations    Screening for cholesterol level  - Plan: Lipid panel, Lipid panel  Screening for diabetes mellitus  - Plan: Hemoglobin A1c, Hemoglobin A1c  Follow-up as needed  Abbe Amsterdam, MD

## 2019-09-13 NOTE — Patient Instructions (Signed)
Preventive Care 26-32 Years Old, Female Preventive care refers to visits with your health care provider and lifestyle choices that can promote health and wellness. This includes:  A yearly physical exam. This may also be called an annual well check.  Regular dental visits and eye exams.  Immunizations.  Screening for certain conditions.  Healthy lifestyle choices, such as eating a healthy diet, getting regular exercise, not using drugs or products that contain nicotine and tobacco, and limiting alcohol use. What can I expect for my preventive care visit? Physical exam Your health care provider will check your:  Height and weight. This may be used to calculate body mass index (BMI), which tells if you are at a healthy weight.  Heart rate and blood pressure.  Skin for abnormal spots. Counseling Your health care provider may ask you questions about your:  Alcohol, tobacco, and drug use.  Emotional well-being.  Home and relationship well-being.  Sexual activity.  Eating habits.  Work and work Statistician.  Method of birth control.  Menstrual cycle.  Pregnancy history. What immunizations do I need?  Influenza (flu) vaccine  This is recommended every year. Tetanus, diphtheria, and pertussis (Tdap) vaccine  You may need a Td booster every 10 years. Varicella (chickenpox) vaccine  You may need this if you have not been vaccinated. Human papillomavirus (HPV) vaccine  If recommended by your health care provider, you may need three doses over 6 months. Measles, mumps, and rubella (MMR) vaccine  You may need at least one dose of MMR. You may also need a second dose. Meningococcal conjugate (MenACWY) vaccine  One dose is recommended if you are age 70-21 years and a first-year college student living in a residence hall, or if you have one of several medical conditions. You may also need additional booster doses. Pneumococcal conjugate (PCV13) vaccine  You may need  this if you have certain conditions and were not previously vaccinated. Pneumococcal polysaccharide (PPSV23) vaccine  You may need one or two doses if you smoke cigarettes or if you have certain conditions. Hepatitis A vaccine  You may need this if you have certain conditions or if you travel or work in places where you may be exposed to hepatitis A. Hepatitis B vaccine  You may need this if you have certain conditions or if you travel or work in places where you may be exposed to hepatitis B. Haemophilus influenzae type b (Hib) vaccine  You may need this if you have certain conditions. You may receive vaccines as individual doses or as more than one vaccine together in one shot (combination vaccines). Talk with your health care provider about the risks and benefits of combination vaccines. What tests do I need?  Blood tests  Lipid and cholesterol levels. These may be checked every 5 years starting at age 65.  Hepatitis C test.  Hepatitis B test. Screening  Diabetes screening. This is done by checking your blood sugar (glucose) after you have not eaten for a while (fasting).  Sexually transmitted disease (STD) testing.  BRCA-related cancer screening. This may be done if you have a family history of breast, ovarian, tubal, or peritoneal cancers.  Pelvic exam and Pap test. This may be done every 3 years starting at age 36. Starting at age 7, this may be done every 5 years if you have a Pap test in combination with an HPV test. Talk with your health care provider about your test results, treatment options, and if necessary, the need for more tests.  Follow these instructions at home: Eating and drinking   Eat a diet that includes fresh fruits and vegetables, whole grains, lean protein, and low-fat dairy.  Take vitamin and mineral supplements as recommended by your health care provider.  Do not drink alcohol if: ? Your health care provider tells you not to drink. ? You are  pregnant, may be pregnant, or are planning to become pregnant.  If you drink alcohol: ? Limit how much you have to 0-1 drink a day. ? Be aware of how much alcohol is in your drink. In the [place] one drink equals one 12 oz bottle of beer (355 mL), one 5 oz glass of wine (148 mL), or one 1 oz glass of hard liquor (44 mL). Lifestyle  Take daily care of your teeth and gums.  Stay active. Exercise for at least 30 minutes on 5 or more days each week.  Do not use any products that contain nicotine or tobacco, such as cigarettes, e-cigarettes, and chewing tobacco. If you need help quitting, ask your health care provider.  If you are sexually active, practice safe sex. Use a condom or other form of birth control (contraception) in order to prevent pregnancy and STIs (sexually transmitted infections). If you plan to become pregnant, see your health care provider for a preconception visit. What's next?  Visit your health care provider once a year for a well check visit.  Ask your health care provider how often you should have your eyes and teeth checked.  Stay up to date on all vaccines. This information is not intended to replace advice given to you by your health care provider. Make sure you discuss any questions you have with your health care provider. Document Revised: 10/19/2017 Document Reviewed: 10/19/2017 Elsevier Patient Education  2020 [employer].

## 2019-09-14 LAB — CBC
HCT: 41.6 % (ref 35.0–45.0)
Hemoglobin: 13.9 g/dL (ref 11.7–15.5)
MCH: 32.3 pg (ref 27.0–33.0)
MCHC: 33.4 g/dL (ref 32.0–36.0)
MCV: 96.5 fL (ref 80.0–100.0)
MPV: 9.5 fL (ref 7.5–12.5)
Platelets: 250 10*3/uL (ref 140–400)
RBC: 4.31 10*6/uL (ref 3.80–5.10)
RDW: 12.5 % (ref 11.0–15.0)
WBC: 5.1 10*3/uL (ref 3.8–10.8)

## 2019-09-14 LAB — COMPREHENSIVE METABOLIC PANEL
AG Ratio: 2 (calc) (ref 1.0–2.5)
ALT: 26 U/L (ref 6–29)
AST: 30 U/L (ref 10–30)
Albumin: 4.3 g/dL (ref 3.6–5.1)
Alkaline phosphatase (APISO): 41 U/L (ref 31–125)
BUN: 14 mg/dL (ref 7–25)
CO2: 25 mmol/L (ref 20–32)
Calcium: 9.1 mg/dL (ref 8.6–10.2)
Chloride: 104 mmol/L (ref 98–110)
Creat: 0.85 mg/dL (ref 0.50–1.10)
Globulin: 2.1 g/dL (calc) (ref 1.9–3.7)
Glucose, Bld: 71 mg/dL (ref 65–99)
Potassium: 3.9 mmol/L (ref 3.5–5.3)
Sodium: 137 mmol/L (ref 135–146)
Total Bilirubin: 0.7 mg/dL (ref 0.2–1.2)
Total Protein: 6.4 g/dL (ref 6.1–8.1)

## 2019-09-14 LAB — LIPID PANEL
Cholesterol: 166 mg/dL (ref <200)
HDL: 63 mg/dL (ref >=50)
LDL Cholesterol (Calc): 83 mg/dL (calc)
Non-HDL Cholesterol (Calc): 103 mg/dL (calc) (ref <130)
Total CHOL/HDL Ratio: 2.6 (calc) (ref <5.0)
Triglycerides: 100 mg/dL (ref <150)

## 2019-09-14 LAB — HEMOGLOBIN A1C
Hgb A1c MFr Bld: 4.7 % of total Hgb (ref <5.7)
Mean Plasma Glucose: 88 (calc)
eAG (mmol/L): 4.9 (calc)

## 2019-09-16 ENCOUNTER — Telehealth: Payer: 59

## 2019-09-16 DIAGNOSIS — S76301A Unspecified injury of muscle, fascia and tendon of the posterior muscle group at thigh level, right thigh, initial encounter: Secondary | ICD-10-CM | POA: Diagnosis not present

## 2019-09-16 DIAGNOSIS — J301 Allergic rhinitis due to pollen: Secondary | ICD-10-CM | POA: Diagnosis not present

## 2019-09-16 DIAGNOSIS — S76309A Unspecified injury of muscle, fascia and tendon of the posterior muscle group at thigh level, unspecified thigh, initial encounter: Secondary | ICD-10-CM | POA: Insufficient documentation

## 2019-09-16 DIAGNOSIS — J3081 Allergic rhinitis due to animal (cat) (dog) hair and dander: Secondary | ICD-10-CM | POA: Diagnosis not present

## 2019-09-16 DIAGNOSIS — J3089 Other allergic rhinitis: Secondary | ICD-10-CM | POA: Diagnosis not present

## 2019-09-23 DIAGNOSIS — S76301A Unspecified injury of muscle, fascia and tendon of the posterior muscle group at thigh level, right thigh, initial encounter: Secondary | ICD-10-CM | POA: Diagnosis not present

## 2019-09-23 DIAGNOSIS — J301 Allergic rhinitis due to pollen: Secondary | ICD-10-CM | POA: Diagnosis not present

## 2019-09-23 DIAGNOSIS — J3089 Other allergic rhinitis: Secondary | ICD-10-CM | POA: Diagnosis not present

## 2019-09-25 DIAGNOSIS — J3081 Allergic rhinitis due to animal (cat) (dog) hair and dander: Secondary | ICD-10-CM | POA: Diagnosis not present

## 2019-09-25 DIAGNOSIS — J301 Allergic rhinitis due to pollen: Secondary | ICD-10-CM | POA: Diagnosis not present

## 2019-10-02 DIAGNOSIS — J301 Allergic rhinitis due to pollen: Secondary | ICD-10-CM | POA: Diagnosis not present

## 2019-10-02 DIAGNOSIS — J3081 Allergic rhinitis due to animal (cat) (dog) hair and dander: Secondary | ICD-10-CM | POA: Diagnosis not present

## 2019-10-02 DIAGNOSIS — J3089 Other allergic rhinitis: Secondary | ICD-10-CM | POA: Diagnosis not present

## 2019-10-04 DIAGNOSIS — J301 Allergic rhinitis due to pollen: Secondary | ICD-10-CM | POA: Diagnosis not present

## 2019-10-04 DIAGNOSIS — J3089 Other allergic rhinitis: Secondary | ICD-10-CM | POA: Diagnosis not present

## 2019-10-04 DIAGNOSIS — J3081 Allergic rhinitis due to animal (cat) (dog) hair and dander: Secondary | ICD-10-CM | POA: Diagnosis not present

## 2019-10-07 DIAGNOSIS — J3081 Allergic rhinitis due to animal (cat) (dog) hair and dander: Secondary | ICD-10-CM | POA: Diagnosis not present

## 2019-10-07 DIAGNOSIS — J301 Allergic rhinitis due to pollen: Secondary | ICD-10-CM | POA: Diagnosis not present

## 2019-10-07 DIAGNOSIS — J3089 Other allergic rhinitis: Secondary | ICD-10-CM | POA: Diagnosis not present

## 2019-10-08 ENCOUNTER — Other Ambulatory Visit: Payer: Self-pay

## 2019-10-08 ENCOUNTER — Other Ambulatory Visit: Payer: 59

## 2019-10-08 DIAGNOSIS — Z20822 Contact with and (suspected) exposure to covid-19: Secondary | ICD-10-CM | POA: Diagnosis not present

## 2019-10-09 LAB — SARS-COV-2, NAA 2 DAY TAT

## 2019-10-09 LAB — NOVEL CORONAVIRUS, NAA: SARS-CoV-2, NAA: NOT DETECTED

## 2019-10-14 DIAGNOSIS — J301 Allergic rhinitis due to pollen: Secondary | ICD-10-CM | POA: Diagnosis not present

## 2019-10-14 DIAGNOSIS — J3081 Allergic rhinitis due to animal (cat) (dog) hair and dander: Secondary | ICD-10-CM | POA: Diagnosis not present

## 2019-10-14 DIAGNOSIS — J3089 Other allergic rhinitis: Secondary | ICD-10-CM | POA: Diagnosis not present

## 2019-10-21 DIAGNOSIS — J301 Allergic rhinitis due to pollen: Secondary | ICD-10-CM | POA: Diagnosis not present

## 2019-10-21 DIAGNOSIS — J3089 Other allergic rhinitis: Secondary | ICD-10-CM | POA: Diagnosis not present

## 2019-10-21 DIAGNOSIS — J3081 Allergic rhinitis due to animal (cat) (dog) hair and dander: Secondary | ICD-10-CM | POA: Diagnosis not present

## 2019-11-01 DIAGNOSIS — J3081 Allergic rhinitis due to animal (cat) (dog) hair and dander: Secondary | ICD-10-CM | POA: Diagnosis not present

## 2019-11-01 DIAGNOSIS — J301 Allergic rhinitis due to pollen: Secondary | ICD-10-CM | POA: Diagnosis not present

## 2019-11-01 DIAGNOSIS — J3089 Other allergic rhinitis: Secondary | ICD-10-CM | POA: Diagnosis not present

## 2019-11-07 DIAGNOSIS — J301 Allergic rhinitis due to pollen: Secondary | ICD-10-CM | POA: Diagnosis not present

## 2019-11-07 DIAGNOSIS — J3081 Allergic rhinitis due to animal (cat) (dog) hair and dander: Secondary | ICD-10-CM | POA: Diagnosis not present

## 2019-11-07 DIAGNOSIS — H1045 Other chronic allergic conjunctivitis: Secondary | ICD-10-CM | POA: Diagnosis not present

## 2019-11-07 DIAGNOSIS — J3089 Other allergic rhinitis: Secondary | ICD-10-CM | POA: Diagnosis not present

## 2019-11-07 MED FILL — AMOXICILLIN 875 MG TABS: 875 | 10 days supply | Qty: 20 | Fill #0

## 2019-11-08 ENCOUNTER — Other Ambulatory Visit: Payer: Self-pay | Admitting: Family Medicine

## 2019-11-08 MED FILL — valACYclovir HCL 1 GM TABS: 1 | 30 days supply | Qty: 30 | Fill #0

## 2019-11-11 DIAGNOSIS — J3089 Other allergic rhinitis: Secondary | ICD-10-CM | POA: Diagnosis not present

## 2019-11-11 DIAGNOSIS — J301 Allergic rhinitis due to pollen: Secondary | ICD-10-CM | POA: Diagnosis not present

## 2019-11-11 DIAGNOSIS — J3081 Allergic rhinitis due to animal (cat) (dog) hair and dander: Secondary | ICD-10-CM | POA: Diagnosis not present

## 2019-11-21 DIAGNOSIS — J3089 Other allergic rhinitis: Secondary | ICD-10-CM | POA: Diagnosis not present

## 2019-11-21 DIAGNOSIS — J3081 Allergic rhinitis due to animal (cat) (dog) hair and dander: Secondary | ICD-10-CM | POA: Diagnosis not present

## 2019-11-21 DIAGNOSIS — J301 Allergic rhinitis due to pollen: Secondary | ICD-10-CM | POA: Diagnosis not present

## 2019-11-27 MED FILL — valACYclovir HCL 1 GM TABS: 1 | 30 days supply | Qty: 30 | Fill #0

## 2019-11-27 MED FILL — ASHLYNA 0.15-0.03-0.01 MG T: 0.15-0.03 & | 91 days supply | Qty: 91 | Fill #2

## 2020-03-10 MED FILL — ASHLYNA 0.15-0.03-0.01 MG T: 0.15-0.03 & | 91 days supply | Qty: 91 | Fill #3

## 2020-03-24 ENCOUNTER — Other Ambulatory Visit: Payer: Self-pay

## 2020-03-24 NOTE — Telephone Encounter (Signed)
Medication refill request: Emily Dudley Last AEX:  03-13-2019 Next AEX: 04-08-2020 Last MMG (if hormonal medication request): none Refill authorized: please approve if appropriate

## 2020-03-25 ENCOUNTER — Other Ambulatory Visit: Payer: Self-pay

## 2020-03-25 MED ORDER — LEVONORGEST-ETH ESTRAD 91-DAY 0.15-0.03 &0.01 MG PO TABS: 1.0000 | ORAL_TABLET | Freq: Every day | ORAL | 0 refills | Status: DC

## 2020-03-25 NOTE — Telephone Encounter (Signed)
Last AEX 03/13/19 with Dr. Edward Jolly Scheduled AEX 04/08/20 with Dr. Edward Jolly

## 2020-04-08 ENCOUNTER — Other Ambulatory Visit: Payer: Self-pay

## 2020-04-08 ENCOUNTER — Encounter: Payer: Self-pay | Admitting: Obstetrics and Gynecology

## 2020-04-08 ENCOUNTER — Ambulatory Visit (INDEPENDENT_AMBULATORY_CARE_PROVIDER_SITE_OTHER): Payer: 59 | Admitting: Obstetrics and Gynecology

## 2020-04-08 VITALS — BP 108/60 | HR 75 | Ht 67.0 in | Wt 179.0 lb

## 2020-04-08 DIAGNOSIS — Z01419 Encounter for gynecological examination (general) (routine) without abnormal findings: Secondary | ICD-10-CM

## 2020-04-08 MED ORDER — LEVONORGEST-ETH ESTRAD 91-DAY 0.15-0.03 &0.01 MG PO TABS: 1.0000 | ORAL_TABLET | Freq: Every day | ORAL | 3 refills | Status: DC

## 2020-04-08 MED ORDER — VALACYCLOVIR HCL 1 G PO TABS: ORAL_TABLET | ORAL | 1 refills | Status: DC

## 2020-04-08 NOTE — Progress Notes (Signed)
35 y.o. G63P0000 Married Caucasian female here for annual exam.    Likes her birth control pills.   Having varicose veins and had surgery for this.  Some superficial thrombophlebitis.  No DVT.   Travel nursing.  Getting her MBA.   Received her Covid booster and flu vaccine.   PCP:  Deeann Saint, MD   Patient's last menstrual period was 03/09/2020.           Sexually active: Yes.    The current method of family planning is OCP (estrogen/progesterone).    Exercising: No.  The patient does not participate in regular exercise at present. Smoker:  no  Health Maintenance: Pap:  03/07/18 Neg:Neg HR HPV  03/31/17 Neg History of abnormal Pap:  no TDaP:  December, 2021. Gardasil:   Completed series HIV and Hep C: 01/06/16 Neg Screening Labs: PCP   reports that she has never smoked. She has never used smokeless tobacco. She reports current alcohol use. She reports that she does not use drugs.  Past Medical History:  Diagnosis Date  . HSV infection   . Varicose veins of legs     Past Surgical History:  Procedure Laterality Date  . CHOLECYSTECTOMY    . ENDOVENOUS ABLATION SAPHENOUS VEIN W/ LASER Left 03/21/2019   endovenous laser ablation left greater saphenous vein and stab phlebectomy 10-20 incisions left leg by Cari Caraway MD     Current Outpatient Medications  Medication Sig Dispense Refill  . AUVI-Q 0.3 MG/0.3ML SOAJ injection     . cetirizine (ZYRTEC) 10 MG tablet TAKE ONE TABLET BY MOUTH ONCE DAILY    . Levonorgestrel-Ethinyl Estradiol (AMETHIA) 0.15-0.03 &0.01 MG tablet Take 1 tablet by mouth daily. 84 tablet 0  . valACYclovir (VALTREX) 1000 MG tablet TAKE 1 TABLET BY MOUTH AS NEEDED AS DIRECTED 30 tablet 0   No current facility-administered medications for this visit.    Family History  Problem Relation Age of Onset  . Aortic aneurysm Father   . Lung cancer Maternal Grandmother   . Cancer Maternal Grandfather 51       prostate  . Aortic aneurysm Cousin      Review of Systems  Constitutional: Negative.   HENT: Negative.   Eyes: Negative.   Respiratory: Negative.   Cardiovascular: Negative.   Gastrointestinal: Negative.   Endocrine: Negative.   Genitourinary: Negative.   Musculoskeletal: Negative.   Skin: Negative.   Allergic/Immunologic: Negative.   Neurological: Negative.   Hematological: Negative.   Psychiatric/Behavioral: Negative.     Exam:   BP 108/60   Pulse 75   Ht 5\' 7"  (1.702 m)   Wt 179 lb (81.2 kg)   LMP 03/09/2020   SpO2 98%   BMI 28.04 kg/m     General appearance: alert, cooperative and appears stated age Head: normocephalic, without obvious abnormality, atraumatic Neck: no adenopathy, supple, symmetrical, trachea midline and thyroid normal to inspection and palpation Lungs: clear to auscultation bilaterally Breasts: normal appearance, no masses or tenderness, No nipple retraction or dimpling, No nipple discharge or bleeding, No axillary adenopathy Heart: regular rate and rhythm Abdomen: soft, non-tender; no masses, no organomegaly Extremities: extremities normal, atraumatic, no cyanosis or edema Skin: skin color, texture, turgor normal. No rashes or lesions Lymph nodes: cervical, supraclavicular, and axillary nodes normal. Neurologic: grossly normal  Pelvic: External genitalia:  no lesions              No abnormal inguinal nodes palpated.  Urethra:  normal appearing urethra with no masses, tenderness or lesions              Bartholins and Skenes: normal                 Vagina: normal appearing vagina with normal color and clumpy green discharge, no lesions              Cervix: no lesions, friable with contact with Q-Tip.              Pap taken: No. Bimanual Exam:  Uterus:  normal size, contour, position, consistency, mobility, non-tender              Adnexa: no mass, fullness, tenderness               Chaperone was present for exam.  Assessment:   Well woman visit with normal exam. Hx  HSV.  Varicose veins.  Superficial thrombophlebitis.  No DVT.  Vaginitis.  I suspect Candida.   Plan: Mammogram screening discussed. Self breast awareness reviewed. Pap and HR HPV as above. Guidelines for Calcium, Vitamin D, regular exercise program including cardiovascular and weight bearing exercise. Patient declines vaginitis evaluation today.  I recommend Monistat OTC if desires to treat.  Refill of COCs for one year.  We briefly talked about progesterone contraception, Micronor, Mirena IUD, or hormone free contraception with ParaGard IUD if she desires to discontinue contraception with estrogen due to her varicose veins.  Follow up annually and prn.

## 2020-04-08 NOTE — Patient Instructions (Signed)

## 2020-05-14 ENCOUNTER — Other Ambulatory Visit (HOSPITAL_BASED_OUTPATIENT_CLINIC_OR_DEPARTMENT_OTHER): Payer: Self-pay

## 2020-06-05 ENCOUNTER — Other Ambulatory Visit: Payer: Self-pay | Admitting: Obstetrics and Gynecology

## 2020-08-25 ENCOUNTER — Encounter: Payer: Self-pay | Admitting: Obstetrics and Gynecology

## 2020-08-25 MED ORDER — LEVONORGEST-ETH ESTRAD 91-DAY 0.15-0.03 &0.01 MG PO TABS: 1.0000 | ORAL_TABLET | Freq: Every day | ORAL | 1 refills | Status: DC

## 2020-10-01 DIAGNOSIS — J3081 Allergic rhinitis due to animal (cat) (dog) hair and dander: Secondary | ICD-10-CM | POA: Diagnosis not present

## 2020-10-01 DIAGNOSIS — J3089 Other allergic rhinitis: Secondary | ICD-10-CM | POA: Diagnosis not present

## 2020-10-01 DIAGNOSIS — J301 Allergic rhinitis due to pollen: Secondary | ICD-10-CM | POA: Diagnosis not present

## 2020-10-14 DIAGNOSIS — J3089 Other allergic rhinitis: Secondary | ICD-10-CM | POA: Diagnosis not present

## 2020-10-14 DIAGNOSIS — J301 Allergic rhinitis due to pollen: Secondary | ICD-10-CM | POA: Diagnosis not present

## 2020-10-14 DIAGNOSIS — J3081 Allergic rhinitis due to animal (cat) (dog) hair and dander: Secondary | ICD-10-CM | POA: Diagnosis not present

## 2020-10-21 DIAGNOSIS — J3081 Allergic rhinitis due to animal (cat) (dog) hair and dander: Secondary | ICD-10-CM | POA: Diagnosis not present

## 2020-10-21 DIAGNOSIS — J3089 Other allergic rhinitis: Secondary | ICD-10-CM | POA: Diagnosis not present

## 2020-10-21 DIAGNOSIS — J301 Allergic rhinitis due to pollen: Secondary | ICD-10-CM | POA: Diagnosis not present

## 2020-10-30 DIAGNOSIS — J3089 Other allergic rhinitis: Secondary | ICD-10-CM | POA: Diagnosis not present

## 2020-10-30 DIAGNOSIS — J3081 Allergic rhinitis due to animal (cat) (dog) hair and dander: Secondary | ICD-10-CM | POA: Diagnosis not present

## 2020-10-30 DIAGNOSIS — J301 Allergic rhinitis due to pollen: Secondary | ICD-10-CM | POA: Diagnosis not present

## 2020-11-03 DIAGNOSIS — J301 Allergic rhinitis due to pollen: Secondary | ICD-10-CM | POA: Diagnosis not present

## 2020-11-03 DIAGNOSIS — J3081 Allergic rhinitis due to animal (cat) (dog) hair and dander: Secondary | ICD-10-CM | POA: Diagnosis not present

## 2020-11-03 DIAGNOSIS — H1045 Other chronic allergic conjunctivitis: Secondary | ICD-10-CM | POA: Diagnosis not present

## 2020-11-03 DIAGNOSIS — J3089 Other allergic rhinitis: Secondary | ICD-10-CM | POA: Diagnosis not present

## 2020-11-11 DIAGNOSIS — J301 Allergic rhinitis due to pollen: Secondary | ICD-10-CM | POA: Diagnosis not present

## 2020-11-11 DIAGNOSIS — J3089 Other allergic rhinitis: Secondary | ICD-10-CM | POA: Diagnosis not present

## 2020-11-11 DIAGNOSIS — J3081 Allergic rhinitis due to animal (cat) (dog) hair and dander: Secondary | ICD-10-CM | POA: Diagnosis not present

## 2020-11-16 DIAGNOSIS — J3081 Allergic rhinitis due to animal (cat) (dog) hair and dander: Secondary | ICD-10-CM | POA: Diagnosis not present

## 2020-11-16 DIAGNOSIS — J301 Allergic rhinitis due to pollen: Secondary | ICD-10-CM | POA: Diagnosis not present

## 2020-11-17 DIAGNOSIS — J3081 Allergic rhinitis due to animal (cat) (dog) hair and dander: Secondary | ICD-10-CM | POA: Diagnosis not present

## 2020-11-17 DIAGNOSIS — J3089 Other allergic rhinitis: Secondary | ICD-10-CM | POA: Diagnosis not present

## 2020-11-17 DIAGNOSIS — J301 Allergic rhinitis due to pollen: Secondary | ICD-10-CM | POA: Diagnosis not present

## 2020-11-24 DIAGNOSIS — J3081 Allergic rhinitis due to animal (cat) (dog) hair and dander: Secondary | ICD-10-CM | POA: Diagnosis not present

## 2020-11-24 DIAGNOSIS — J301 Allergic rhinitis due to pollen: Secondary | ICD-10-CM | POA: Diagnosis not present

## 2020-11-24 DIAGNOSIS — J3089 Other allergic rhinitis: Secondary | ICD-10-CM | POA: Diagnosis not present

## 2020-11-30 ENCOUNTER — Other Ambulatory Visit (HOSPITAL_COMMUNITY): Payer: Self-pay

## 2020-11-30 ENCOUNTER — Other Ambulatory Visit: Payer: Self-pay | Admitting: Obstetrics and Gynecology

## 2020-11-30 MED ORDER — LEVONORGEST-ETH ESTRAD 91-DAY 0.15-0.03 &0.01 MG PO TABS
1.0000 | ORAL_TABLET | Freq: Every day | ORAL | 0 refills | Status: DC
Start: 2020-11-30 — End: 2021-03-08
  Filled 2020-11-30: qty 91, 91d supply, fill #0

## 2020-12-01 DIAGNOSIS — J301 Allergic rhinitis due to pollen: Secondary | ICD-10-CM | POA: Diagnosis not present

## 2020-12-01 DIAGNOSIS — J3081 Allergic rhinitis due to animal (cat) (dog) hair and dander: Secondary | ICD-10-CM | POA: Diagnosis not present

## 2020-12-01 DIAGNOSIS — J3089 Other allergic rhinitis: Secondary | ICD-10-CM | POA: Diagnosis not present

## 2020-12-09 DIAGNOSIS — J3089 Other allergic rhinitis: Secondary | ICD-10-CM | POA: Diagnosis not present

## 2020-12-09 DIAGNOSIS — J3081 Allergic rhinitis due to animal (cat) (dog) hair and dander: Secondary | ICD-10-CM | POA: Diagnosis not present

## 2020-12-09 DIAGNOSIS — J301 Allergic rhinitis due to pollen: Secondary | ICD-10-CM | POA: Diagnosis not present

## 2020-12-24 ENCOUNTER — Telehealth: Payer: 59 | Admitting: Family Medicine

## 2020-12-24 DIAGNOSIS — R599 Enlarged lymph nodes, unspecified: Secondary | ICD-10-CM

## 2020-12-24 NOTE — Progress Notes (Signed)
Emily Dudley   Decided to wait a day or two will do another visit if needed

## 2020-12-28 ENCOUNTER — Encounter: Payer: Self-pay | Admitting: Family Medicine

## 2020-12-28 ENCOUNTER — Other Ambulatory Visit: Payer: Self-pay

## 2020-12-28 ENCOUNTER — Ambulatory Visit: Payer: 59 | Admitting: Family Medicine

## 2020-12-28 VITALS — BP 112/72 | HR 84 | Temp 98.4°F | Wt 186.8 lb

## 2020-12-28 DIAGNOSIS — K115 Sialolithiasis: Secondary | ICD-10-CM

## 2020-12-28 DIAGNOSIS — J3089 Other allergic rhinitis: Secondary | ICD-10-CM | POA: Diagnosis not present

## 2020-12-28 DIAGNOSIS — J301 Allergic rhinitis due to pollen: Secondary | ICD-10-CM | POA: Diagnosis not present

## 2020-12-28 DIAGNOSIS — J3081 Allergic rhinitis due to animal (cat) (dog) hair and dander: Secondary | ICD-10-CM | POA: Diagnosis not present

## 2020-12-28 NOTE — Progress Notes (Signed)
Subjective:    Patient ID: Emily Dudley, female    DOB: Jul 14, 1985, 35 y.o.   MRN: [id number]  Chief Complaint  Patient presents with   Jaw Pain    Thinks a salivary glands is swollen. Started on Wednesday     HPI Patient was seen today for acute concern.  Pt noticed edema and discomfort under tongue with first bite of food on Wednesday.  Pt states the sensation with food went away by Jun 26, 2022 and the edema is improving.  Pt tried ibuprofen, massage, and a warm compress.  Past Medical History:  Diagnosis Date   HSV infection    Varicose veins of legs     No Known Allergies  ROS General: Denies fever, chills, night sweats, changes in weight, changes in appetite HEENT: Denies headaches, ear pain, changes in vision, rhinorrhea, sore throat CV: Denies CP, palpitations, SOB, orthopnea Pulm: Denies SOB, cough, wheezing GI: Denies abdominal pain, nausea, vomiting, diarrhea, constipation GU: Denies dysuria, hematuria, frequency, vaginal discharge Msk: Denies muscle cramps, joint pains Neuro: Denies weakness, numbness, tingling Skin: Denies rashes, bruising +edema under r side of chin/face Psych: Denies depression, anxiety, hallucinations     Objective:    Blood pressure 112/72, pulse 84, temperature 98.4 F (36.9 C), temperature source Oral, weight 186 lb 12.8 oz (84.7 kg), SpO2 98 %.  Gen. Pleasant, well-nourished, in no distress, normal affect   HEENT: /AT, face symmetric, conjunctiva clear, no scleral icterus, PERRLA, EOMI, nares patent without drainage, pharynx without erythema or exudate. No cervical lymphadenopathy.  TMs normal b/l.  Mild R submandibular edema.  No carries or dental abscesses. Lungs: no accessory muscle use Cardiovascular: RRR, no m/r/g, no peripheral edema Musculoskeletal: No deformities, no cyanosis or clubbing, normal tone Neuro:  A&Ox3, CN II-XII intact, normal gait Skin:  Warm, no lesions/ rash   Wt Readings from Last 3 Encounters:  12/28/20  186 lb 12.8 oz (84.7 kg)  04/08/20 179 lb (81.2 kg)  09/13/19 165 lb 6 oz (75 kg)    Lab Results  Component Value Date   WBC 5.1 09/13/2019   HGB 13.9 09/13/2019   HCT 41.6 09/13/2019   PLT 250 09/13/2019   GLUCOSE 71 09/13/2019   CHOL 166 09/13/2019   TRIG 100 09/13/2019   HDL 63 09/13/2019   LDLCALC 83 09/13/2019   ALT 26 09/13/2019   AST 30 09/13/2019   NA 137 09/13/2019   K 3.9 09/13/2019   CL 104 09/13/2019   CREATININE 0.85 09/13/2019   BUN 14 09/13/2019   CO2 25 09/13/2019   HGBA1C 4.7 09/13/2019    Assessment/Plan:  Submandibular sialodocholithiasis -stone likely partially blocking duct.  Likely passed on 26-Jun-2022. -discussed supportive care -given handout -given precautions  F/u prn  Abbe Amsterdam, MD

## 2021-01-06 DIAGNOSIS — J301 Allergic rhinitis due to pollen: Secondary | ICD-10-CM | POA: Diagnosis not present

## 2021-01-06 DIAGNOSIS — J3081 Allergic rhinitis due to animal (cat) (dog) hair and dander: Secondary | ICD-10-CM | POA: Diagnosis not present

## 2021-01-06 DIAGNOSIS — J3089 Other allergic rhinitis: Secondary | ICD-10-CM | POA: Diagnosis not present

## 2021-01-12 DIAGNOSIS — J301 Allergic rhinitis due to pollen: Secondary | ICD-10-CM | POA: Diagnosis not present

## 2021-01-12 DIAGNOSIS — J3089 Other allergic rhinitis: Secondary | ICD-10-CM | POA: Diagnosis not present

## 2021-01-12 DIAGNOSIS — J3081 Allergic rhinitis due to animal (cat) (dog) hair and dander: Secondary | ICD-10-CM | POA: Diagnosis not present

## 2021-01-19 DIAGNOSIS — J301 Allergic rhinitis due to pollen: Secondary | ICD-10-CM | POA: Diagnosis not present

## 2021-01-19 DIAGNOSIS — J3089 Other allergic rhinitis: Secondary | ICD-10-CM | POA: Diagnosis not present

## 2021-01-19 DIAGNOSIS — J3081 Allergic rhinitis due to animal (cat) (dog) hair and dander: Secondary | ICD-10-CM | POA: Diagnosis not present

## 2021-01-25 DIAGNOSIS — J3089 Other allergic rhinitis: Secondary | ICD-10-CM | POA: Diagnosis not present

## 2021-01-25 DIAGNOSIS — J3081 Allergic rhinitis due to animal (cat) (dog) hair and dander: Secondary | ICD-10-CM | POA: Diagnosis not present

## 2021-01-25 DIAGNOSIS — J301 Allergic rhinitis due to pollen: Secondary | ICD-10-CM | POA: Diagnosis not present

## 2021-02-04 DIAGNOSIS — J3089 Other allergic rhinitis: Secondary | ICD-10-CM | POA: Diagnosis not present

## 2021-02-04 DIAGNOSIS — J3081 Allergic rhinitis due to animal (cat) (dog) hair and dander: Secondary | ICD-10-CM | POA: Diagnosis not present

## 2021-02-04 DIAGNOSIS — J301 Allergic rhinitis due to pollen: Secondary | ICD-10-CM | POA: Diagnosis not present

## 2021-02-09 DIAGNOSIS — J3081 Allergic rhinitis due to animal (cat) (dog) hair and dander: Secondary | ICD-10-CM | POA: Diagnosis not present

## 2021-02-09 DIAGNOSIS — J3089 Other allergic rhinitis: Secondary | ICD-10-CM | POA: Diagnosis not present

## 2021-02-09 DIAGNOSIS — J301 Allergic rhinitis due to pollen: Secondary | ICD-10-CM | POA: Diagnosis not present

## 2021-02-24 DIAGNOSIS — J301 Allergic rhinitis due to pollen: Secondary | ICD-10-CM | POA: Diagnosis not present

## 2021-02-24 DIAGNOSIS — J3081 Allergic rhinitis due to animal (cat) (dog) hair and dander: Secondary | ICD-10-CM | POA: Diagnosis not present

## 2021-02-24 DIAGNOSIS — J3089 Other allergic rhinitis: Secondary | ICD-10-CM | POA: Diagnosis not present

## 2021-03-03 DIAGNOSIS — J3089 Other allergic rhinitis: Secondary | ICD-10-CM | POA: Diagnosis not present

## 2021-03-03 DIAGNOSIS — J3081 Allergic rhinitis due to animal (cat) (dog) hair and dander: Secondary | ICD-10-CM | POA: Diagnosis not present

## 2021-03-03 DIAGNOSIS — J301 Allergic rhinitis due to pollen: Secondary | ICD-10-CM | POA: Diagnosis not present

## 2021-03-08 ENCOUNTER — Other Ambulatory Visit (HOSPITAL_COMMUNITY): Payer: Self-pay

## 2021-03-08 ENCOUNTER — Other Ambulatory Visit: Payer: Self-pay | Admitting: Obstetrics and Gynecology

## 2021-03-08 MED ORDER — LEVONORGEST-ETH ESTRAD 91-DAY 0.15-0.03 &0.01 MG PO TABS
1.0000 | ORAL_TABLET | Freq: Every day | ORAL | 0 refills | Status: DC
  Filled 2021-03-08: qty 91, 91d supply, fill #0

## 2021-03-08 NOTE — Telephone Encounter (Signed)
Annual exam scheduled on 05/12/21 Last annual exam was on 04/08/20

## 2021-03-10 DIAGNOSIS — J3081 Allergic rhinitis due to animal (cat) (dog) hair and dander: Secondary | ICD-10-CM | POA: Diagnosis not present

## 2021-03-10 DIAGNOSIS — J3089 Other allergic rhinitis: Secondary | ICD-10-CM | POA: Diagnosis not present

## 2021-03-10 DIAGNOSIS — J301 Allergic rhinitis due to pollen: Secondary | ICD-10-CM | POA: Diagnosis not present

## 2021-03-23 DIAGNOSIS — J3081 Allergic rhinitis due to animal (cat) (dog) hair and dander: Secondary | ICD-10-CM | POA: Diagnosis not present

## 2021-03-23 DIAGNOSIS — J301 Allergic rhinitis due to pollen: Secondary | ICD-10-CM | POA: Diagnosis not present

## 2021-03-23 DIAGNOSIS — J3089 Other allergic rhinitis: Secondary | ICD-10-CM | POA: Diagnosis not present

## 2021-04-01 DIAGNOSIS — J3089 Other allergic rhinitis: Secondary | ICD-10-CM | POA: Diagnosis not present

## 2021-04-01 DIAGNOSIS — J3081 Allergic rhinitis due to animal (cat) (dog) hair and dander: Secondary | ICD-10-CM | POA: Diagnosis not present

## 2021-04-01 DIAGNOSIS — J301 Allergic rhinitis due to pollen: Secondary | ICD-10-CM | POA: Diagnosis not present

## 2021-04-06 DIAGNOSIS — J3081 Allergic rhinitis due to animal (cat) (dog) hair and dander: Secondary | ICD-10-CM | POA: Diagnosis not present

## 2021-04-06 DIAGNOSIS — J301 Allergic rhinitis due to pollen: Secondary | ICD-10-CM | POA: Diagnosis not present

## 2021-04-06 DIAGNOSIS — J3089 Other allergic rhinitis: Secondary | ICD-10-CM | POA: Diagnosis not present

## 2021-04-13 DIAGNOSIS — J301 Allergic rhinitis due to pollen: Secondary | ICD-10-CM | POA: Diagnosis not present

## 2021-04-13 DIAGNOSIS — J3089 Other allergic rhinitis: Secondary | ICD-10-CM | POA: Diagnosis not present

## 2021-04-13 DIAGNOSIS — J3081 Allergic rhinitis due to animal (cat) (dog) hair and dander: Secondary | ICD-10-CM | POA: Diagnosis not present

## 2021-04-27 ENCOUNTER — Other Ambulatory Visit (HOSPITAL_COMMUNITY): Payer: Self-pay

## 2021-04-27 MED ORDER — EPINEPHRINE 0.3 MG/0.3ML IJ SOAJ
INTRAMUSCULAR | 1 refills | Status: DC
  Filled 2021-04-27: qty 2, 30d supply, fill #0

## 2021-04-29 ENCOUNTER — Other Ambulatory Visit (HOSPITAL_COMMUNITY): Payer: Self-pay

## 2021-05-06 DIAGNOSIS — M238X1 Other internal derangements of right knee: Secondary | ICD-10-CM | POA: Diagnosis not present

## 2021-05-06 DIAGNOSIS — J301 Allergic rhinitis due to pollen: Secondary | ICD-10-CM | POA: Diagnosis not present

## 2021-05-06 DIAGNOSIS — J3081 Allergic rhinitis due to animal (cat) (dog) hair and dander: Secondary | ICD-10-CM | POA: Diagnosis not present

## 2021-05-06 DIAGNOSIS — M25561 Pain in right knee: Secondary | ICD-10-CM | POA: Diagnosis not present

## 2021-05-06 DIAGNOSIS — J3089 Other allergic rhinitis: Secondary | ICD-10-CM | POA: Diagnosis not present

## 2021-05-10 DIAGNOSIS — M25561 Pain in right knee: Secondary | ICD-10-CM | POA: Diagnosis not present

## 2021-05-11 NOTE — Progress Notes (Signed)
36 y.o. G31P0000 Married Caucasian female here for annual exam.   ? ?[place]arey Seat skiing in [place] and torn ligaments in her right knee.  ?She has an MRI tomorrow.  ?Not currently working.  ? ?Happy with her birth control pills and wants to continue.  ?No problems with increased varicose veins. ? ?PCP:   Abbe Amsterdam, MD ? ?Patient's last menstrual period was 03/12/2021.     ?Period Cycle (Days): 90 ?Period Duration (Days): 3 ?Period Pattern: Regular ?Menstrual Flow: Light ?Dysmenorrhea: None ?    ?Sexually active: Yes.    ?The current method of family planning is OCP (estrogen/progesterone).    ?Exercising: Yes.    Gym/health club.  ?Smoker:  no ? ?Health Maintenance: ?Pap:  03/07/18 Neg:Neg HR HPV, 03/31/17 Neg ?History of abnormal Pap:  no ?MMG:  N/A ?Colonoscopy:  N/A ?BMD:   n/a  Result  n/a ?TDaP:  01/2020 ?Gardasil:   yes, completed ?HIV: 01-06-16 NR ?Hep C: 01-06-16 Neg ?Screening Labs:  declined ? ? reports that she has never smoked. She has never used smokeless tobacco. She reports current alcohol use. She reports that she does not use drugs. ? ?Past Medical History:  ?Diagnosis Date  ? HSV infection   ? Varicose veins of legs   ? ? ?Past Surgical History:  ?Procedure Laterality Date  ? CHOLECYSTECTOMY    ? ENDOVENOUS ABLATION SAPHENOUS VEIN W/ LASER Left 03/21/2019  ? endovenous laser ablation left greater saphenous vein and stab phlebectomy 10-20 incisions left leg by Cari Caraway MD   ? ? ?Current Outpatient Medications  ?Medication Sig Dispense Refill  ? AUVI-Q 0.3 MG/0.3ML SOAJ injection     ? cetirizine (ZYRTEC) 10 MG tablet TAKE ONE TABLET BY MOUTH ONCE DAILY    ? EPINEPHrine 0.3 mg/0.3 mL IJ SOAJ injection Inject 1 pen into the upper thigh as needed for systemic reactions as directed. 2 each 1  ? Levonorgestrel-Ethinyl Estradiol (DAYSEE) 0.15-0.03 &0.01 MG tablet Take 1 tablet by mouth daily. 91 tablet 0  ? valACYclovir (VALTREX) 1000 MG tablet TAKE 1 TABLET BY MOUTH AS NEEDED AS DIRECTED 30 tablet 1  ? ?No  current facility-administered medications for this visit.  ? ? ?Family History  ?Problem Relation Age of Onset  ? Aortic aneurysm Father   ? Lung cancer Maternal Grandmother   ? Cancer Maternal Grandfather 61  ?     prostate  ? Aortic aneurysm Cousin   ? ? ?Review of Systems  ?All other systems reviewed and are negative. ? ?Exam:   ?BP 112/70 (BP Location: Right Arm, Patient Position: Sitting, Cuff Size: Normal)   Pulse 94   Ht 5\' 6"  (1.676 m)   Wt 179 lb (81.2 kg)   LMP 03/12/2021   SpO2 100%   BMI 28.89 kg/m?     ?General appearance: alert, cooperative and appears stated age ?Head: normocephalic, without obvious abnormality, atraumatic ?Neck: no adenopathy, supple, symmetrical, trachea midline and thyroid normal to inspection and palpation ?Lungs: clear to auscultation bilaterally ?Breasts: normal appearance, no masses or tenderness, No nipple retraction or dimpling, No nipple discharge or bleeding, No axillary adenopathy ?Heart: regular rate and rhythm ?Abdomen: soft, non-tender; no masses, no organomegaly ?Extremities: extremities normal, atraumatic, no cyanosis or edema ?Skin: skin color, texture, turgor normal. No rashes or lesions ?Lymph nodes: cervical, supraclavicular, and axillary nodes normal. ?Neurologic: grossly normal ? ?Pelvic: External genitalia:  no lesions ?             No abnormal inguinal nodes palpated. ?  Urethra:  normal appearing urethra with no masses, tenderness or lesions ?             Bartholins and Skenes: normal    ?             Vagina: normal appearing vagina with normal color and discharge, no lesions ?             Cervix: no lesions ?             Pap taken: No ?Bimanual Exam:  Uterus:  normal size, contour, position, consistency, mobility, non-tender ?             Adnexa: no mass, fullness, tenderness ?     ? ?Chaperone was present for exam:  Malen Gauze, CMA ? ?Assessment:   ?Well woman visit with gynecologic exam. ?Hx HSV, genital. ?Varicose veins.  Superficial  thrombophlebitis.  No DVT.  ? ?Plan: ?Mammogram screening discussed. ?Self breast awareness reviewed. ?Pap and HR HPV 2025 ?Guidelines for Calcium, Vitamin D, regular exercise program including cardiovascular and weight bearing exercise. ?Refill of COCs for one year.  ?Refill of Valtrex 1000 mg po daily for 5 days prn.  ?Follow up annually and prn.  ?  ?After visit summary provided.  ? ? ? ?

## 2021-05-12 ENCOUNTER — Ambulatory Visit (INDEPENDENT_AMBULATORY_CARE_PROVIDER_SITE_OTHER): Payer: 59 | Admitting: Obstetrics and Gynecology

## 2021-05-12 ENCOUNTER — Other Ambulatory Visit: Payer: Self-pay

## 2021-05-12 ENCOUNTER — Encounter: Payer: Self-pay | Admitting: Obstetrics and Gynecology

## 2021-05-12 ENCOUNTER — Other Ambulatory Visit (HOSPITAL_COMMUNITY): Payer: Self-pay

## 2021-05-12 VITALS — BP 112/70 | HR 94 | Ht 66.0 in | Wt 179.0 lb

## 2021-05-12 DIAGNOSIS — Z01419 Encounter for gynecological examination (general) (routine) without abnormal findings: Secondary | ICD-10-CM | POA: Diagnosis not present

## 2021-05-12 MED ORDER — LEVONORGEST-ETH ESTRAD 91-DAY 0.15-0.03 &0.01 MG PO TABS
1.0000 | ORAL_TABLET | Freq: Every day | ORAL | 3 refills | Status: DC
  Filled 2021-05-12 – 2021-05-24 (×2): qty 91, 91d supply, fill #0
  Filled 2021-08-27: qty 91, 91d supply, fill #1
  Filled 2021-11-22: qty 91, 91d supply, fill #2
  Filled 2022-02-25: qty 91, 91d supply, fill #3

## 2021-05-12 MED ORDER — VALACYCLOVIR HCL 1 G PO TABS
ORAL_TABLET | ORAL | 1 refills | Status: DC
  Filled 2021-05-12: qty 30, fill #0
  Filled 2021-05-27: qty 30, 30d supply, fill #0

## 2021-05-12 NOTE — Patient Instructions (Signed)

## 2021-05-13 DIAGNOSIS — J301 Allergic rhinitis due to pollen: Secondary | ICD-10-CM | POA: Diagnosis not present

## 2021-05-13 DIAGNOSIS — J3089 Other allergic rhinitis: Secondary | ICD-10-CM | POA: Diagnosis not present

## 2021-05-13 DIAGNOSIS — M25561 Pain in right knee: Secondary | ICD-10-CM | POA: Diagnosis not present

## 2021-05-13 DIAGNOSIS — J3081 Allergic rhinitis due to animal (cat) (dog) hair and dander: Secondary | ICD-10-CM | POA: Diagnosis not present

## 2021-05-14 DIAGNOSIS — S83519A Sprain of anterior cruciate ligament of unspecified knee, initial encounter: Secondary | ICD-10-CM | POA: Insufficient documentation

## 2021-05-18 DIAGNOSIS — S83281A Other tear of lateral meniscus, current injury, right knee, initial encounter: Secondary | ICD-10-CM | POA: Diagnosis not present

## 2021-05-18 DIAGNOSIS — S83511D Sprain of anterior cruciate ligament of right knee, subsequent encounter: Secondary | ICD-10-CM | POA: Diagnosis not present

## 2021-05-18 DIAGNOSIS — S83289A Other tear of lateral meniscus, current injury, unspecified knee, initial encounter: Secondary | ICD-10-CM | POA: Insufficient documentation

## 2021-05-18 DIAGNOSIS — S83511S Sprain of anterior cruciate ligament of right knee, sequela: Secondary | ICD-10-CM | POA: Diagnosis not present

## 2021-05-20 DIAGNOSIS — J301 Allergic rhinitis due to pollen: Secondary | ICD-10-CM | POA: Diagnosis not present

## 2021-05-20 DIAGNOSIS — J3089 Other allergic rhinitis: Secondary | ICD-10-CM | POA: Diagnosis not present

## 2021-05-20 DIAGNOSIS — J3081 Allergic rhinitis due to animal (cat) (dog) hair and dander: Secondary | ICD-10-CM | POA: Diagnosis not present

## 2021-05-24 ENCOUNTER — Other Ambulatory Visit (HOSPITAL_COMMUNITY): Payer: Self-pay

## 2021-05-27 ENCOUNTER — Other Ambulatory Visit (HOSPITAL_COMMUNITY): Payer: Self-pay

## 2021-05-27 DIAGNOSIS — J3089 Other allergic rhinitis: Secondary | ICD-10-CM | POA: Diagnosis not present

## 2021-05-27 DIAGNOSIS — J301 Allergic rhinitis due to pollen: Secondary | ICD-10-CM | POA: Diagnosis not present

## 2021-05-27 DIAGNOSIS — J3081 Allergic rhinitis due to animal (cat) (dog) hair and dander: Secondary | ICD-10-CM | POA: Diagnosis not present

## 2021-05-28 ENCOUNTER — Other Ambulatory Visit (HOSPITAL_COMMUNITY): Payer: Self-pay

## 2021-05-29 DIAGNOSIS — J3089 Other allergic rhinitis: Secondary | ICD-10-CM | POA: Diagnosis not present

## 2021-06-01 DIAGNOSIS — J3081 Allergic rhinitis due to animal (cat) (dog) hair and dander: Secondary | ICD-10-CM | POA: Diagnosis not present

## 2021-06-01 DIAGNOSIS — J301 Allergic rhinitis due to pollen: Secondary | ICD-10-CM | POA: Diagnosis not present

## 2021-06-01 DIAGNOSIS — J3089 Other allergic rhinitis: Secondary | ICD-10-CM | POA: Diagnosis not present

## 2021-06-02 ENCOUNTER — Other Ambulatory Visit (HOSPITAL_COMMUNITY): Payer: Self-pay

## 2021-06-02 DIAGNOSIS — G8918 Other acute postprocedural pain: Secondary | ICD-10-CM | POA: Diagnosis not present

## 2021-06-02 DIAGNOSIS — M94261 Chondromalacia, right knee: Secondary | ICD-10-CM | POA: Diagnosis not present

## 2021-06-02 DIAGNOSIS — S83261A Peripheral tear of lateral meniscus, current injury, right knee, initial encounter: Secondary | ICD-10-CM | POA: Diagnosis not present

## 2021-06-02 DIAGNOSIS — M659 Synovitis and tenosynovitis, unspecified: Secondary | ICD-10-CM | POA: Diagnosis not present

## 2021-06-02 DIAGNOSIS — S83511A Sprain of anterior cruciate ligament of right knee, initial encounter: Secondary | ICD-10-CM | POA: Diagnosis not present

## 2021-06-02 HISTORY — PX: ANTERIOR CRUCIATE LIGAMENT REPAIR: SHX115

## 2021-06-02 MED ORDER — ONDANSETRON 4 MG PO TBDP
ORAL_TABLET | ORAL | 0 refills | Status: DC
  Filled 2021-06-02: qty 22, 7d supply, fill #0

## 2021-06-02 MED ORDER — OXYCODONE HCL 5 MG PO TABS
ORAL_TABLET | ORAL | 0 refills | Status: DC
  Filled 2021-06-02: qty 25, 5d supply, fill #0

## 2021-06-02 MED ORDER — METHOCARBAMOL 500 MG PO TABS
ORAL_TABLET | ORAL | 0 refills | Status: DC
  Filled 2021-06-02: qty 45, 12d supply, fill #0

## 2021-06-02 MED ORDER — DIAZEPAM 5 MG PO TABS
ORAL_TABLET | ORAL | 0 refills | Status: DC
  Filled 2021-06-02: qty 15, 5d supply, fill #0

## 2021-06-04 ENCOUNTER — Other Ambulatory Visit (HOSPITAL_COMMUNITY): Payer: Self-pay

## 2021-06-08 DIAGNOSIS — S83511D Sprain of anterior cruciate ligament of right knee, subsequent encounter: Secondary | ICD-10-CM | POA: Diagnosis not present

## 2021-06-08 DIAGNOSIS — M25561 Pain in right knee: Secondary | ICD-10-CM | POA: Diagnosis not present

## 2021-06-09 DIAGNOSIS — J3089 Other allergic rhinitis: Secondary | ICD-10-CM | POA: Diagnosis not present

## 2021-06-09 DIAGNOSIS — J3081 Allergic rhinitis due to animal (cat) (dog) hair and dander: Secondary | ICD-10-CM | POA: Diagnosis not present

## 2021-06-09 DIAGNOSIS — J301 Allergic rhinitis due to pollen: Secondary | ICD-10-CM | POA: Diagnosis not present

## 2021-06-11 DIAGNOSIS — M25561 Pain in right knee: Secondary | ICD-10-CM | POA: Diagnosis not present

## 2021-06-11 DIAGNOSIS — S83511D Sprain of anterior cruciate ligament of right knee, subsequent encounter: Secondary | ICD-10-CM | POA: Diagnosis not present

## 2021-06-15 DIAGNOSIS — J3081 Allergic rhinitis due to animal (cat) (dog) hair and dander: Secondary | ICD-10-CM | POA: Diagnosis not present

## 2021-06-15 DIAGNOSIS — M25561 Pain in right knee: Secondary | ICD-10-CM | POA: Diagnosis not present

## 2021-06-15 DIAGNOSIS — J301 Allergic rhinitis due to pollen: Secondary | ICD-10-CM | POA: Diagnosis not present

## 2021-06-15 DIAGNOSIS — J3089 Other allergic rhinitis: Secondary | ICD-10-CM | POA: Diagnosis not present

## 2021-06-15 DIAGNOSIS — S83511D Sprain of anterior cruciate ligament of right knee, subsequent encounter: Secondary | ICD-10-CM | POA: Diagnosis not present

## 2021-06-18 DIAGNOSIS — S83511D Sprain of anterior cruciate ligament of right knee, subsequent encounter: Secondary | ICD-10-CM | POA: Diagnosis not present

## 2021-06-18 DIAGNOSIS — M25561 Pain in right knee: Secondary | ICD-10-CM | POA: Diagnosis not present

## 2021-06-22 DIAGNOSIS — S83511D Sprain of anterior cruciate ligament of right knee, subsequent encounter: Secondary | ICD-10-CM | POA: Diagnosis not present

## 2021-06-22 DIAGNOSIS — M25561 Pain in right knee: Secondary | ICD-10-CM | POA: Diagnosis not present

## 2021-06-23 DIAGNOSIS — J3089 Other allergic rhinitis: Secondary | ICD-10-CM | POA: Diagnosis not present

## 2021-06-23 DIAGNOSIS — J301 Allergic rhinitis due to pollen: Secondary | ICD-10-CM | POA: Diagnosis not present

## 2021-06-23 DIAGNOSIS — J3081 Allergic rhinitis due to animal (cat) (dog) hair and dander: Secondary | ICD-10-CM | POA: Diagnosis not present

## 2021-06-25 DIAGNOSIS — M25561 Pain in right knee: Secondary | ICD-10-CM | POA: Diagnosis not present

## 2021-06-25 DIAGNOSIS — S83511D Sprain of anterior cruciate ligament of right knee, subsequent encounter: Secondary | ICD-10-CM | POA: Diagnosis not present

## 2021-06-28 DIAGNOSIS — S83511D Sprain of anterior cruciate ligament of right knee, subsequent encounter: Secondary | ICD-10-CM | POA: Diagnosis not present

## 2021-06-28 DIAGNOSIS — M25561 Pain in right knee: Secondary | ICD-10-CM | POA: Diagnosis not present

## 2021-07-01 DIAGNOSIS — S83511D Sprain of anterior cruciate ligament of right knee, subsequent encounter: Secondary | ICD-10-CM | POA: Diagnosis not present

## 2021-07-01 DIAGNOSIS — M25561 Pain in right knee: Secondary | ICD-10-CM | POA: Diagnosis not present

## 2021-07-02 DIAGNOSIS — J301 Allergic rhinitis due to pollen: Secondary | ICD-10-CM | POA: Diagnosis not present

## 2021-07-02 DIAGNOSIS — J3081 Allergic rhinitis due to animal (cat) (dog) hair and dander: Secondary | ICD-10-CM | POA: Diagnosis not present

## 2021-07-02 DIAGNOSIS — J3089 Other allergic rhinitis: Secondary | ICD-10-CM | POA: Diagnosis not present

## 2021-07-05 DIAGNOSIS — M25561 Pain in right knee: Secondary | ICD-10-CM | POA: Diagnosis not present

## 2021-07-05 DIAGNOSIS — S83511D Sprain of anterior cruciate ligament of right knee, subsequent encounter: Secondary | ICD-10-CM | POA: Diagnosis not present

## 2021-07-06 DIAGNOSIS — J3081 Allergic rhinitis due to animal (cat) (dog) hair and dander: Secondary | ICD-10-CM | POA: Diagnosis not present

## 2021-07-06 DIAGNOSIS — J3089 Other allergic rhinitis: Secondary | ICD-10-CM | POA: Diagnosis not present

## 2021-07-06 DIAGNOSIS — J301 Allergic rhinitis due to pollen: Secondary | ICD-10-CM | POA: Diagnosis not present

## 2021-07-07 DIAGNOSIS — M25561 Pain in right knee: Secondary | ICD-10-CM | POA: Diagnosis not present

## 2021-07-07 DIAGNOSIS — S83511D Sprain of anterior cruciate ligament of right knee, subsequent encounter: Secondary | ICD-10-CM | POA: Diagnosis not present

## 2021-07-13 ENCOUNTER — Other Ambulatory Visit (HOSPITAL_COMMUNITY): Payer: Self-pay

## 2021-07-13 DIAGNOSIS — S83511D Sprain of anterior cruciate ligament of right knee, subsequent encounter: Secondary | ICD-10-CM | POA: Diagnosis not present

## 2021-07-13 DIAGNOSIS — M25561 Pain in right knee: Secondary | ICD-10-CM | POA: Diagnosis not present

## 2021-07-13 DIAGNOSIS — Z4789 Encounter for other orthopedic aftercare: Secondary | ICD-10-CM | POA: Diagnosis not present

## 2021-07-13 MED ORDER — ONDANSETRON 4 MG PO TBDP
ORAL_TABLET | ORAL | 0 refills | Status: DC
  Filled 2021-07-13: qty 22, 7d supply, fill #0

## 2021-07-14 DIAGNOSIS — J3081 Allergic rhinitis due to animal (cat) (dog) hair and dander: Secondary | ICD-10-CM | POA: Diagnosis not present

## 2021-07-14 DIAGNOSIS — J301 Allergic rhinitis due to pollen: Secondary | ICD-10-CM | POA: Diagnosis not present

## 2021-07-14 DIAGNOSIS — J3089 Other allergic rhinitis: Secondary | ICD-10-CM | POA: Diagnosis not present

## 2021-07-20 DIAGNOSIS — S83511D Sprain of anterior cruciate ligament of right knee, subsequent encounter: Secondary | ICD-10-CM | POA: Diagnosis not present

## 2021-07-20 DIAGNOSIS — M25561 Pain in right knee: Secondary | ICD-10-CM | POA: Diagnosis not present

## 2021-08-04 DIAGNOSIS — M25561 Pain in right knee: Secondary | ICD-10-CM | POA: Diagnosis not present

## 2021-08-04 DIAGNOSIS — S83511D Sprain of anterior cruciate ligament of right knee, subsequent encounter: Secondary | ICD-10-CM | POA: Diagnosis not present

## 2021-08-04 DIAGNOSIS — J3089 Other allergic rhinitis: Secondary | ICD-10-CM | POA: Diagnosis not present

## 2021-08-04 DIAGNOSIS — J301 Allergic rhinitis due to pollen: Secondary | ICD-10-CM | POA: Diagnosis not present

## 2021-08-04 DIAGNOSIS — J3081 Allergic rhinitis due to animal (cat) (dog) hair and dander: Secondary | ICD-10-CM | POA: Diagnosis not present

## 2021-08-06 DIAGNOSIS — J3081 Allergic rhinitis due to animal (cat) (dog) hair and dander: Secondary | ICD-10-CM | POA: Diagnosis not present

## 2021-08-06 DIAGNOSIS — J301 Allergic rhinitis due to pollen: Secondary | ICD-10-CM | POA: Diagnosis not present

## 2021-08-06 DIAGNOSIS — J3089 Other allergic rhinitis: Secondary | ICD-10-CM | POA: Diagnosis not present

## 2021-08-11 DIAGNOSIS — S83511D Sprain of anterior cruciate ligament of right knee, subsequent encounter: Secondary | ICD-10-CM | POA: Diagnosis not present

## 2021-08-11 DIAGNOSIS — M25561 Pain in right knee: Secondary | ICD-10-CM | POA: Diagnosis not present

## 2021-08-12 ENCOUNTER — Ambulatory Visit (INDEPENDENT_AMBULATORY_CARE_PROVIDER_SITE_OTHER): Payer: 59 | Admitting: Family Medicine

## 2021-08-12 VITALS — BP 100/80 | HR 77 | Temp 98.5°F | Ht 67.5 in | Wt 198.2 lb

## 2021-08-12 DIAGNOSIS — Z1159 Encounter for screening for other viral diseases: Secondary | ICD-10-CM

## 2021-08-12 DIAGNOSIS — J3089 Other allergic rhinitis: Secondary | ICD-10-CM | POA: Diagnosis not present

## 2021-08-12 DIAGNOSIS — J3081 Allergic rhinitis due to animal (cat) (dog) hair and dander: Secondary | ICD-10-CM | POA: Diagnosis not present

## 2021-08-12 DIAGNOSIS — J301 Allergic rhinitis due to pollen: Secondary | ICD-10-CM | POA: Diagnosis not present

## 2021-08-12 DIAGNOSIS — Z0000 Encounter for general adult medical examination without abnormal findings: Secondary | ICD-10-CM | POA: Diagnosis not present

## 2021-08-12 LAB — CBC WITH DIFFERENTIAL/PLATELET
Basophils Absolute: 0 10*3/uL (ref 0.0–0.1)
Basophils Relative: 0.6 % (ref 0.0–3.0)
Eosinophils Absolute: 0.1 10*3/uL (ref 0.0–0.7)
Eosinophils Relative: 1.5 % (ref 0.0–5.0)
HCT: 42.4 % (ref 36.0–46.0)
Hemoglobin: 14.1 g/dL (ref 12.0–15.0)
Lymphocytes Relative: 31.8 % (ref 12.0–46.0)
Lymphs Abs: 2.5 10*3/uL (ref 0.7–4.0)
MCHC: 33.3 g/dL (ref 30.0–36.0)
MCV: 95.4 fl (ref 78.0–100.0)
Monocytes Absolute: 0.6 10*3/uL (ref 0.1–1.0)
Monocytes Relative: 8 % (ref 3.0–12.0)
Neutro Abs: 4.5 10*3/uL (ref 1.4–7.7)
Neutrophils Relative %: 58.1 % (ref 43.0–77.0)
Platelets: 288 10*3/uL (ref 150.0–400.0)
RBC: 4.45 Mil/uL (ref 3.87–5.11)
RDW: 15.2 % (ref 11.5–15.5)
WBC: 7.7 10*3/uL (ref 4.0–10.5)

## 2021-08-12 LAB — LIPID PANEL
Cholesterol: 150 mg/dL (ref 0–200)
HDL: 58.6 mg/dL (ref >39.00)
LDL Cholesterol: 64 mg/dL (ref 0–99)
NonHDL: 90.92
Total CHOL/HDL Ratio: 3
Triglycerides: 134 mg/dL (ref 0.0–149.0)
VLDL: 26.8 mg/dL (ref 0.0–40.0)

## 2021-08-12 LAB — BASIC METABOLIC PANEL
BUN: 14 mg/dL (ref 6–23)
CO2: 24 mEq/L (ref 19–32)
Calcium: 8.5 mg/dL (ref 8.4–10.5)
Chloride: 106 mEq/L (ref 96–112)
Creatinine, Ser: 0.79 mg/dL (ref 0.40–1.20)
GFR: 97.93 mL/min (ref >60.00)
Glucose, Bld: 90 mg/dL (ref 70–99)
Potassium: 3.6 mEq/L (ref 3.5–5.1)
Sodium: 137 mEq/L (ref 135–145)

## 2021-08-12 LAB — T4, FREE: Free T4: 0.85 ng/dL (ref 0.60–1.60)

## 2021-08-12 LAB — HEMOGLOBIN A1C: Hgb A1c MFr Bld: 4.7 % (ref 4.6–6.5)

## 2021-08-12 LAB — TSH: TSH: 4.31 u[IU]/mL (ref 0.35–5.50)

## 2021-08-12 NOTE — Progress Notes (Signed)
Subjective:     Emily Dudley is a 36 y.o. female and is here for a comprehensive physical exam. The patient reports doing well.  Pt is 10 wks s/p R knee surgery for rupture of anterior cruciate ligament.   Pt was cleared yesterday to exercise on the elliptical.  Working with PT.  Pt has been out of work since her knee injury.  Pt followed by OB/Gyn, last pap 03/09/2018.  Social History   Socioeconomic History   Marital status: Married    Spouse name: Not on file   Number of children: Not on file   Years of education: Not on file   Highest education level: Bachelor's degree (e.g., BA, AB, BS)  Occupational History   Not on file  Tobacco Use   Smoking status: Never   Smokeless tobacco: Never  Vaping Use   Vaping Use: Never used  Substance and Sexual Activity   Alcohol use: Yes    Comment: socially   Drug use: Never   Sexual activity: Yes    Birth control/protection: Pill  Other Topics Concern   Not on file  Social History Narrative   Not on file   Social Determinants of Health   Financial Resource Strain: Low Risk  (12/27/2020)   Overall Financial Resource Strain (CARDIA)    Difficulty of Paying Living Expenses: Not hard at all  Food Insecurity: No Food Insecurity (12/27/2020)   Hunger Vital Sign    Worried About Running Out of Food in the Last Year: Never true    Ran Out of Food in the Last Year: Never true  Transportation Needs: No Transportation Needs (12/27/2020)   PRAPARE - Administrator, Civil Service (Medical): No    Lack of Transportation (Non-Medical): No  Physical Activity: Sufficiently Active (12/27/2020)   Exercise Vital Sign    Days of Exercise per Week: 4 days    Minutes of Exercise per Session: 50 min  Stress: No Stress Concern Present (12/27/2020)   [employer] of Occupational Health - Occupational Stress Questionnaire    Feeling of Stress : Not at all  Social Connections: Moderately Integrated (12/27/2020)   Social Connection and  Isolation Panel [NHANES]    Frequency of Communication with Friends and Family: More than three times a week    Frequency of Social Gatherings with Friends and Family: More than three times a week    Attends Religious Services: Never    Database administrator or Organizations: Yes    Attends Engineer, structural: More than 4 times per year    Marital Status: Married  Catering manager Violence: Not on file   Health Maintenance  Topic Date Due   HIV Screening  Never done   Hepatitis C Screening  Never done   COVID-19 Vaccine (4 - Pfizer series) 12/29/2020   PAP SMEAR-Modifier  11/12/2021 (Originally 03/07/2021)   INFLUENZA VACCINE  09/21/2021   TETANUS/TDAP  02/06/2030   HPV VACCINES  Aged Out    The following portions of the patient's history were reviewed and updated as appropriate: allergies, current medications, past family history, past medical history, past social history, past surgical history, and problem list.  Review of Systems Pertinent items noted in HPI and remainder of comprehensive ROS otherwise negative.   Objective:    BP 100/80 (BP Location: Right Arm, Patient Position: Sitting, Cuff Size: Normal)   Pulse 77   Temp 98.5 F (36.9 C) (Oral)   Ht 5' 7.5" (1.715 m)  Wt 198 lb 3.2 oz (89.9 kg)   LMP 03/12/2021 Comment: irregular  SpO2 95%   BMI 30.58 kg/m  General appearance: alert, cooperative, and no distress Head: Normocephalic, without obvious abnormality, atraumatic Eyes: conjunctivae/corneas clear. PERRL, EOM's intact. Fundi benign. Ears: normal TM's and external ear canals both ears Nose: Nares normal. Septum midline. Mucosa normal. No drainage or sinus tenderness. Throat: lips, mucosa, and tongue normal; teeth and gums normal Neck: no adenopathy, no carotid bruit, no JVD, supple, symmetrical, trachea midline, and thyroid not enlarged, symmetric, no tenderness/mass/nodules Lungs: clear to auscultation bilaterally Heart: regular rate and rhythm,  S1, S2 normal, no murmur, click, rub or gallop Abdomen: soft, non-tender; bowel sounds normal; no masses,  no organomegaly Extremities: extremities normal, atraumatic, no cyanosis or edema  wearing brace on R knee. Pulses: 2+ and symmetric Skin: Skin color, texture, turgor normal. No rashes or lesions Lymph nodes: Cervical, supraclavicular, and axillary nodes normal. Neurologic: Alert and oriented X 3, normal strength and tone. Normal symmetric reflexes. Normal coordination and gait    Assessment:    Healthy female exam.      Plan:    Anticipatory guidance given including wearing seatbelts, smoke detectors in the home, increasing physical activity, increasing p.o. intake of water and vegetables. -labs -pap up to date, done 03/09/2018.  Repeat in 2025. -Mammogram not indicated 2/2 age -Colonoscopy not indicated 2/2 age -Next CPE in 1 year See After Visit Summary for Counseling Recommendations   - Plan: CBC with Differential/Platelet, Basic metabolic panel, TSH, T4, Free, Hemoglobin A1c, Lipid panel  Hep C screen  -Plan: Hep C antibody  F/u prn  Abbe Amsterdam, MD

## 2021-08-16 DIAGNOSIS — J3081 Allergic rhinitis due to animal (cat) (dog) hair and dander: Secondary | ICD-10-CM | POA: Diagnosis not present

## 2021-08-16 DIAGNOSIS — J301 Allergic rhinitis due to pollen: Secondary | ICD-10-CM | POA: Diagnosis not present

## 2021-08-16 DIAGNOSIS — J3089 Other allergic rhinitis: Secondary | ICD-10-CM | POA: Diagnosis not present

## 2021-08-18 DIAGNOSIS — J3081 Allergic rhinitis due to animal (cat) (dog) hair and dander: Secondary | ICD-10-CM | POA: Diagnosis not present

## 2021-08-18 DIAGNOSIS — J3089 Other allergic rhinitis: Secondary | ICD-10-CM | POA: Diagnosis not present

## 2021-08-18 DIAGNOSIS — S83511D Sprain of anterior cruciate ligament of right knee, subsequent encounter: Secondary | ICD-10-CM | POA: Diagnosis not present

## 2021-08-18 DIAGNOSIS — J301 Allergic rhinitis due to pollen: Secondary | ICD-10-CM | POA: Diagnosis not present

## 2021-08-18 DIAGNOSIS — M25561 Pain in right knee: Secondary | ICD-10-CM | POA: Diagnosis not present

## 2021-08-25 DIAGNOSIS — J301 Allergic rhinitis due to pollen: Secondary | ICD-10-CM | POA: Diagnosis not present

## 2021-08-25 DIAGNOSIS — J3081 Allergic rhinitis due to animal (cat) (dog) hair and dander: Secondary | ICD-10-CM | POA: Diagnosis not present

## 2021-08-25 DIAGNOSIS — J3089 Other allergic rhinitis: Secondary | ICD-10-CM | POA: Diagnosis not present

## 2021-08-27 ENCOUNTER — Other Ambulatory Visit (HOSPITAL_COMMUNITY): Payer: Self-pay

## 2021-09-01 DIAGNOSIS — J3081 Allergic rhinitis due to animal (cat) (dog) hair and dander: Secondary | ICD-10-CM | POA: Diagnosis not present

## 2021-09-01 DIAGNOSIS — S83511D Sprain of anterior cruciate ligament of right knee, subsequent encounter: Secondary | ICD-10-CM | POA: Diagnosis not present

## 2021-09-01 DIAGNOSIS — M25561 Pain in right knee: Secondary | ICD-10-CM | POA: Diagnosis not present

## 2021-09-01 DIAGNOSIS — J301 Allergic rhinitis due to pollen: Secondary | ICD-10-CM | POA: Diagnosis not present

## 2021-09-01 DIAGNOSIS — J3089 Other allergic rhinitis: Secondary | ICD-10-CM | POA: Diagnosis not present

## 2021-09-08 DIAGNOSIS — J301 Allergic rhinitis due to pollen: Secondary | ICD-10-CM | POA: Diagnosis not present

## 2021-09-08 DIAGNOSIS — J3081 Allergic rhinitis due to animal (cat) (dog) hair and dander: Secondary | ICD-10-CM | POA: Diagnosis not present

## 2021-09-08 DIAGNOSIS — J3089 Other allergic rhinitis: Secondary | ICD-10-CM | POA: Diagnosis not present

## 2021-09-15 DIAGNOSIS — J3089 Other allergic rhinitis: Secondary | ICD-10-CM | POA: Diagnosis not present

## 2021-09-15 DIAGNOSIS — J301 Allergic rhinitis due to pollen: Secondary | ICD-10-CM | POA: Diagnosis not present

## 2021-09-15 DIAGNOSIS — J3081 Allergic rhinitis due to animal (cat) (dog) hair and dander: Secondary | ICD-10-CM | POA: Diagnosis not present

## 2021-09-21 DIAGNOSIS — S83511D Sprain of anterior cruciate ligament of right knee, subsequent encounter: Secondary | ICD-10-CM | POA: Diagnosis not present

## 2021-09-21 DIAGNOSIS — M25561 Pain in right knee: Secondary | ICD-10-CM | POA: Diagnosis not present

## 2021-09-28 DIAGNOSIS — J3089 Other allergic rhinitis: Secondary | ICD-10-CM | POA: Diagnosis not present

## 2021-09-28 DIAGNOSIS — J301 Allergic rhinitis due to pollen: Secondary | ICD-10-CM | POA: Diagnosis not present

## 2021-09-28 DIAGNOSIS — J3081 Allergic rhinitis due to animal (cat) (dog) hair and dander: Secondary | ICD-10-CM | POA: Diagnosis not present

## 2021-10-05 DIAGNOSIS — J3089 Other allergic rhinitis: Secondary | ICD-10-CM | POA: Diagnosis not present

## 2021-10-05 DIAGNOSIS — J3081 Allergic rhinitis due to animal (cat) (dog) hair and dander: Secondary | ICD-10-CM | POA: Diagnosis not present

## 2021-10-05 DIAGNOSIS — J301 Allergic rhinitis due to pollen: Secondary | ICD-10-CM | POA: Diagnosis not present

## 2021-10-14 DIAGNOSIS — S83511D Sprain of anterior cruciate ligament of right knee, subsequent encounter: Secondary | ICD-10-CM | POA: Diagnosis not present

## 2021-10-14 DIAGNOSIS — M25561 Pain in right knee: Secondary | ICD-10-CM | POA: Diagnosis not present

## 2021-10-21 DIAGNOSIS — J301 Allergic rhinitis due to pollen: Secondary | ICD-10-CM | POA: Diagnosis not present

## 2021-10-21 DIAGNOSIS — J3081 Allergic rhinitis due to animal (cat) (dog) hair and dander: Secondary | ICD-10-CM | POA: Diagnosis not present

## 2021-10-21 DIAGNOSIS — J3089 Other allergic rhinitis: Secondary | ICD-10-CM | POA: Diagnosis not present

## 2021-10-31 IMAGING — MR MR MRA CHEST W/ OR W/O CM
37 series · 37 of 37 positions shown · IV contrast (agent unspecified)
Comparison: None.

CLINICAL DATA: 33-year-old with family history of thoracic aortic
aneurysm.

EXAM:
MRA CHEST WITHOUT CONTRAST; MRA ABDOMEN WITHOUT CONTRAST
TECHNIQUE: Angiographic images of the chest and abdomen were obtained without
contrast.
CONTRAST:  None

[Series 4: t2_trufi_tra_p2_bh · axial · 8.0mm · 0.56mm/px · 1 of 35 slices shown (1 of 2)]
[im 1/35]
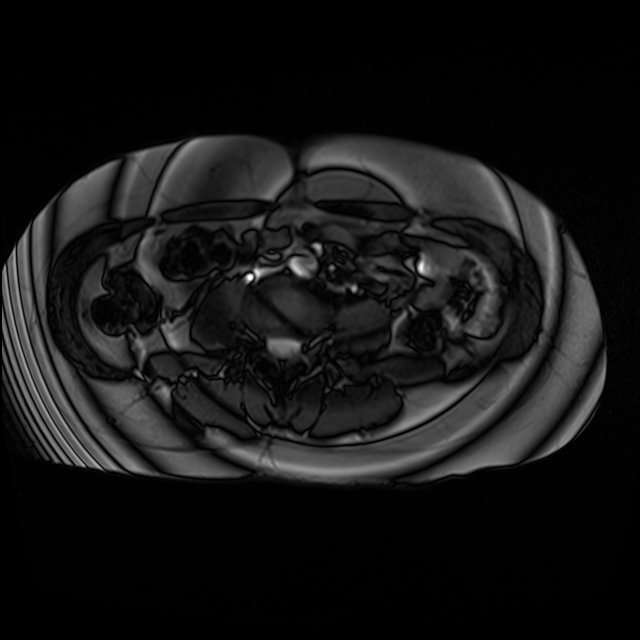

[Series 5: t2_trufi_tra_p2_bh · axial · 8.0mm · 0.56mm/px · 1 of 35 slices shown (2 of 2)]
[im 1/35]
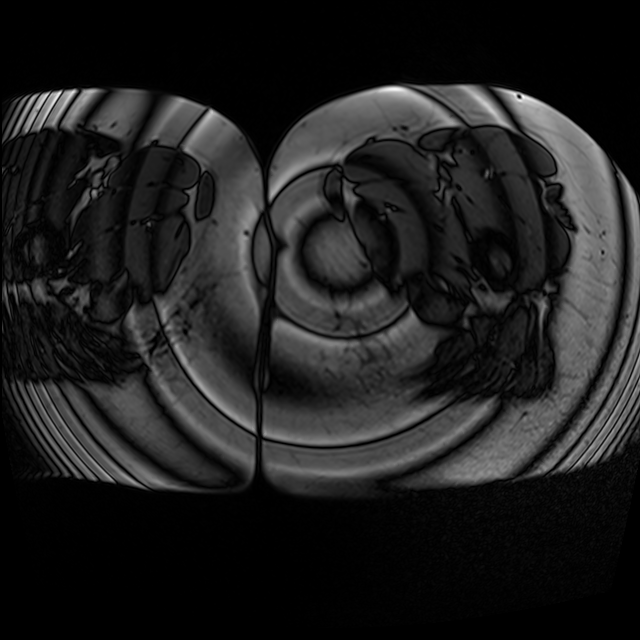

[Series 6: axial_db_haste_loc · axial · 7.0mm · 1.48mm/px · 1 of 40 slices shown (1 of 2)]
[im 1/40]
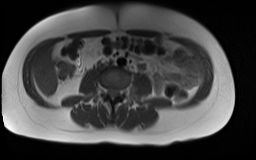

[Series 7: axial_db_haste_loc · axial · 7.0mm · 1.48mm/px · 1 of 40 slices shown (2 of 2)]
[im 1/40]
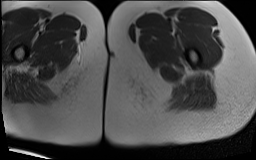

[Series 8: (person_name)_(person_name)_(person_name) · sagittal · 8.0mm · 1.79mm/px · 1 of 26 slices shown (1 of 17)]
[im 1/26]
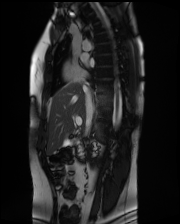

[Series 8: (person_name)_(person_name)_(person_name) · sagittal · 8.0mm · 1.79mm/px · 1 of 26 slices shown (2 of 17)]
[im 1/26]
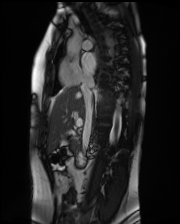

[Series 8: (person_name)_(person_name)_(person_name) · sagittal · 8.0mm · 1.79mm/px · 1 of 26 slices shown (3 of 17)]
[im 1/26]
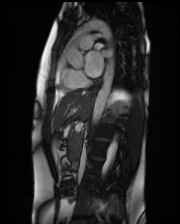

[Series 8: (person_name)_(person_name)_(person_name) · sagittal · 8.0mm · 1.79mm/px · 1 of 26 slices shown (4 of 17)]
[im 1/26]
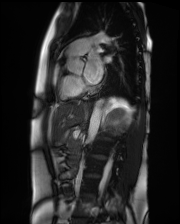

[Series 8: (person_name)_(person_name)_(person_name) · sagittal · 8.0mm · 1.79mm/px · 1 of 26 slices shown (5 of 17)]
[im 1/26]
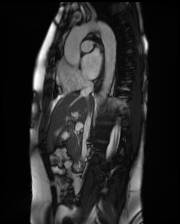

[Series 9: (person_name)_(person_name)_(person_name) · sagittal · 8.0mm · 1.79mm/px · 1 of 25 slices shown (6 of 17)]
[im 1/25]
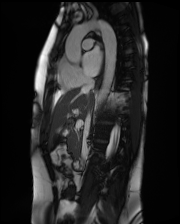

[Series 9: (person_name)_(person_name)_(person_name) · sagittal · 8.0mm · 1.79mm/px · 1 of 25 slices shown (7 of 17)]
[im 1/25]
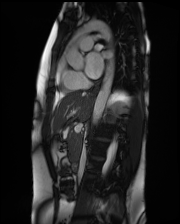

[Series 9: (person_name)_(person_name)_(person_name) · sagittal · 8.0mm · 1.79mm/px · 1 of 25 slices shown (8 of 17)]
[im 1/25]
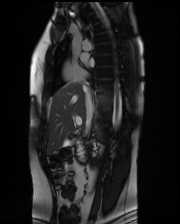

[Series 9: (person_name)_(person_name)_(person_name) · sagittal · 8.0mm · 1.79mm/px · 1 of 25 slices shown (9 of 17)]
[im 1/25]
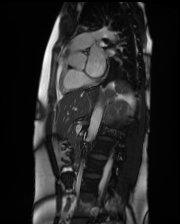

[Series 9: (person_name)_(person_name)_(person_name) · sagittal · 8.0mm · 1.79mm/px · 1 of 25 slices shown (10 of 17)]
[im 1/25]
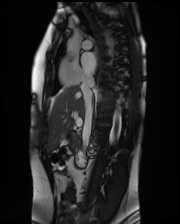

[Series 10: localizer_trufi_multislice · axial · 8.0mm · 1.56mm/px · 1 of 12 slices shown]
[im 1/12]
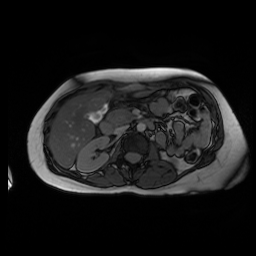

[Series 11: sagittal localizer_trufi_heart · coronal · 7.0mm · 1.79mm/px · 1 of 10 slices shown]
[im 1/10]
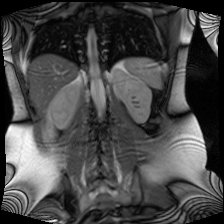

[Series 12: (person_name)_(person_name)_(person_name) · sagittal · 8.0mm · 1.88mm/px · 1 of 25 slices shown (11 of 17)]
[im 1/25]
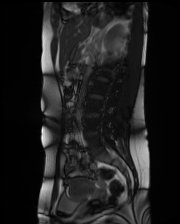

[Series 12: (person_name)_(person_name)_(person_name) · sagittal · 8.0mm · 1.88mm/px · 1 of 25 slices shown (12 of 17)]
[im 1/25]
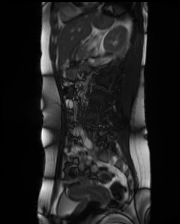

[Series 12: (person_name)_(person_name)_(person_name) · sagittal · 8.0mm · 1.88mm/px · 1 of 25 slices shown (13 of 17)]
[im 1/25]
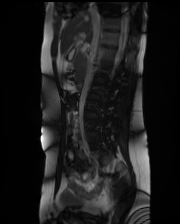

[Series 12: (person_name)_(person_name)_(person_name) · sagittal · 8.0mm · 1.88mm/px · 1 of 25 slices shown (14 of 17)]
[im 1/25]
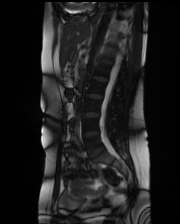

[Series 12: (person_name)_(person_name)_(person_name) · sagittal · 8.0mm · 1.88mm/px · 1 of 25 slices shown (15 of 17)]
[im 1/25]
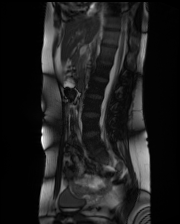

[Series 12: (person_name)_(person_name)_(person_name) · sagittal · 8.0mm · 1.88mm/px · 1 of 25 slices shown (16 of 17)]
[im 1/25]
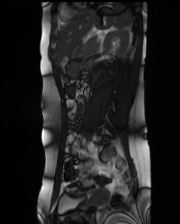

[Series 12: (person_name)_(person_name)_(person_name) · sagittal · 8.0mm · 1.88mm/px · 1 of 25 slices shown (17 of 17)]
[im 1/25]
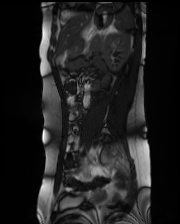

[Series 14: T1 dynamic · axial · non-contrast · 3.3mm · 1.09mm/px · 1 of 80 slices shown (1 of 2)]
[im 1/80]
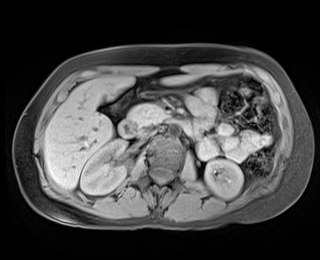

[Series 15: bSSFP · oblique · 6.0mm · 1.56mm/px · 1 of 15 slices shown (1 of 12)]
[im 1/15]
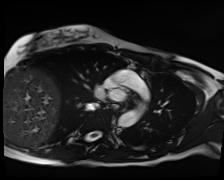

[Series 15: bSSFP · oblique · 6.0mm · 1.56mm/px · 1 of 15 slices shown (2 of 12)]
[im 1/15]
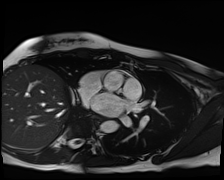

[Series 15: bSSFP · oblique · 6.0mm · 1.56mm/px · 1 of 15 slices shown (3 of 12)]
[im 1/15]
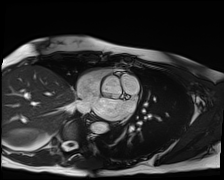

[Series 15: bSSFP · oblique · 6.0mm · 1.56mm/px · 1 of 15 slices shown (4 of 12)]
[im 1/15]
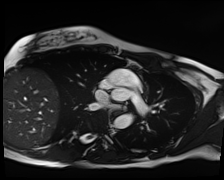

[Series 15: bSSFP · oblique · 6.0mm · 1.56mm/px · 1 of 15 slices shown (5 of 12)]
[im 1/15]
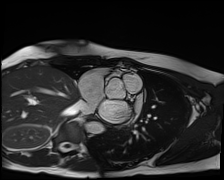

[Series 15: bSSFP · oblique · 6.0mm · 1.56mm/px · 1 of 15 slices shown (6 of 12)]
[im 1/15]
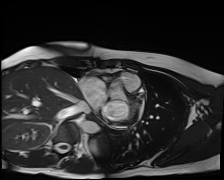

[Series 15: bSSFP · oblique · 6.0mm · 1.56mm/px · 1 of 15 slices shown (7 of 12)]
[im 1/15]
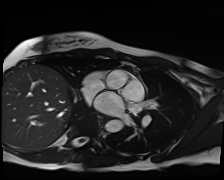

[Series 15: bSSFP · oblique · 6.0mm · 1.56mm/px · 1 of 15 slices shown (8 of 12)]
[im 1/15]
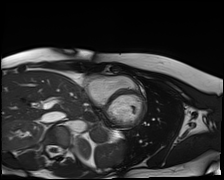

[Series 15: bSSFP · oblique · 6.0mm · 1.56mm/px · 1 of 15 slices shown (9 of 12)]
[im 1/15]
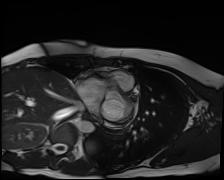

[Series 15: bSSFP · oblique · 6.0mm · 1.56mm/px · 1 of 15 slices shown (10 of 12)]
[im 1/15]
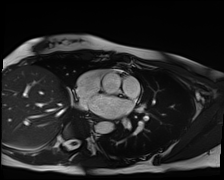

[Series 15: bSSFP · oblique · 6.0mm · 1.56mm/px · 1 of 15 slices shown (11 of 12)]
[im 1/15]
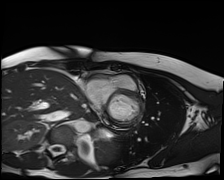

[Series 15: bSSFP · oblique · 6.0mm · 1.56mm/px · 1 of 15 slices shown (12 of 12)]
[im 1/15]
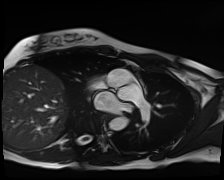

[Series 16: T1 dynamic · axial · non-contrast · 3.3mm · 1.09mm/px · 1 of 96 slices shown (2 of 2)]
[im 1/96]
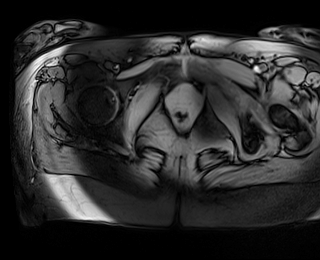

[37 of 37 positions shown; findings below may reference images not displayed]

FINDINGS: VASCULAR

Aorta: Aortic valve is not well demonstrated on this examination.
Aortic root measures up to 3.2 cm. Normal caliber of the ascending
thoracic aorta measuring 2.8 cm. Proximal aortic arch measures
cm. Distal aortic arch measures roughly 2.0 cm. Descending thoracic
aorta measures 2.2 cm. No evidence for an aortic dissection. Limited
evaluation of the great vessels but normal caliber of the proximal
great vessels. Normal caliber of the abdominal aorta without
aneurysm. Normal caliber of the common and external iliac arteries.
Normal caliber of the proximal femoral arteries.

Heart: Heart size is normal.  No significant pericardial effusion.

Pulmonary Arteries:  Main pulmonary artery measures 2.7 cm.

Other: Venous structures are grossly normal.

NON-VASCULAR

No gross abnormalities within the lungs. No large pleural effusions.
Gallbladder is surgically absent. No gross abnormality to the liver.
Normal size of the spleen. No gross abnormality to the adrenal
glands, pancreas or kidneys. Limited evaluation of the bowel and
pelvic structures on this non contrast examination. Normal
appearance of the urinary bladder. No gross bone abnormality.
IMPRESSION: 1. Normal caliber of the thoracic and abdominal aorta. No evidence
for an aortic aneurysm.
2. Limited evaluation of the aortic valve.

## 2021-11-02 DIAGNOSIS — J3089 Other allergic rhinitis: Secondary | ICD-10-CM | POA: Diagnosis not present

## 2021-11-02 DIAGNOSIS — J3081 Allergic rhinitis due to animal (cat) (dog) hair and dander: Secondary | ICD-10-CM | POA: Diagnosis not present

## 2021-11-02 DIAGNOSIS — Z9889 Other specified postprocedural states: Secondary | ICD-10-CM | POA: Diagnosis not present

## 2021-11-02 DIAGNOSIS — J301 Allergic rhinitis due to pollen: Secondary | ICD-10-CM | POA: Diagnosis not present

## 2021-11-04 DIAGNOSIS — S83511D Sprain of anterior cruciate ligament of right knee, subsequent encounter: Secondary | ICD-10-CM | POA: Diagnosis not present

## 2021-11-04 DIAGNOSIS — M25561 Pain in right knee: Secondary | ICD-10-CM | POA: Diagnosis not present

## 2021-11-09 DIAGNOSIS — J3089 Other allergic rhinitis: Secondary | ICD-10-CM | POA: Diagnosis not present

## 2021-11-09 DIAGNOSIS — J3081 Allergic rhinitis due to animal (cat) (dog) hair and dander: Secondary | ICD-10-CM | POA: Diagnosis not present

## 2021-11-09 DIAGNOSIS — J301 Allergic rhinitis due to pollen: Secondary | ICD-10-CM | POA: Diagnosis not present

## 2021-11-10 DIAGNOSIS — J3081 Allergic rhinitis due to animal (cat) (dog) hair and dander: Secondary | ICD-10-CM | POA: Diagnosis not present

## 2021-11-10 DIAGNOSIS — J301 Allergic rhinitis due to pollen: Secondary | ICD-10-CM | POA: Diagnosis not present

## 2021-11-11 ENCOUNTER — Other Ambulatory Visit (HOSPITAL_BASED_OUTPATIENT_CLINIC_OR_DEPARTMENT_OTHER): Payer: Self-pay

## 2021-11-11 DIAGNOSIS — J3089 Other allergic rhinitis: Secondary | ICD-10-CM | POA: Diagnosis not present

## 2021-11-11 MED ORDER — FLUARIX QUADRIVALENT 0.5 ML IM SUSY
PREFILLED_SYRINGE | INTRAMUSCULAR | 0 refills | Status: DC
  Filled 2021-11-11: qty 0.5, 1d supply, fill #0

## 2021-11-17 DIAGNOSIS — J3089 Other allergic rhinitis: Secondary | ICD-10-CM | POA: Diagnosis not present

## 2021-11-17 DIAGNOSIS — J301 Allergic rhinitis due to pollen: Secondary | ICD-10-CM | POA: Diagnosis not present

## 2021-11-17 DIAGNOSIS — J3081 Allergic rhinitis due to animal (cat) (dog) hair and dander: Secondary | ICD-10-CM | POA: Diagnosis not present

## 2021-11-22 ENCOUNTER — Other Ambulatory Visit (HOSPITAL_COMMUNITY): Payer: Self-pay

## 2021-11-22 ENCOUNTER — Encounter: Payer: Self-pay | Admitting: Family Medicine

## 2021-11-23 ENCOUNTER — Other Ambulatory Visit (HOSPITAL_COMMUNITY): Payer: Self-pay

## 2021-11-25 DIAGNOSIS — J3089 Other allergic rhinitis: Secondary | ICD-10-CM | POA: Diagnosis not present

## 2021-11-25 DIAGNOSIS — J301 Allergic rhinitis due to pollen: Secondary | ICD-10-CM | POA: Diagnosis not present

## 2021-11-25 DIAGNOSIS — J3081 Allergic rhinitis due to animal (cat) (dog) hair and dander: Secondary | ICD-10-CM | POA: Diagnosis not present

## 2021-12-08 ENCOUNTER — Ambulatory Visit: Payer: 59 | Admitting: Family Medicine

## 2021-12-09 DIAGNOSIS — J301 Allergic rhinitis due to pollen: Secondary | ICD-10-CM | POA: Diagnosis not present

## 2021-12-09 DIAGNOSIS — J3089 Other allergic rhinitis: Secondary | ICD-10-CM | POA: Diagnosis not present

## 2021-12-09 DIAGNOSIS — J3081 Allergic rhinitis due to animal (cat) (dog) hair and dander: Secondary | ICD-10-CM | POA: Diagnosis not present

## 2021-12-15 DIAGNOSIS — J3081 Allergic rhinitis due to animal (cat) (dog) hair and dander: Secondary | ICD-10-CM | POA: Diagnosis not present

## 2021-12-15 DIAGNOSIS — J3089 Other allergic rhinitis: Secondary | ICD-10-CM | POA: Diagnosis not present

## 2021-12-15 DIAGNOSIS — J301 Allergic rhinitis due to pollen: Secondary | ICD-10-CM | POA: Diagnosis not present

## 2021-12-23 DIAGNOSIS — J3081 Allergic rhinitis due to animal (cat) (dog) hair and dander: Secondary | ICD-10-CM | POA: Diagnosis not present

## 2021-12-23 DIAGNOSIS — J301 Allergic rhinitis due to pollen: Secondary | ICD-10-CM | POA: Diagnosis not present

## 2021-12-23 DIAGNOSIS — J3089 Other allergic rhinitis: Secondary | ICD-10-CM | POA: Diagnosis not present

## 2021-12-28 DIAGNOSIS — J301 Allergic rhinitis due to pollen: Secondary | ICD-10-CM | POA: Diagnosis not present

## 2021-12-28 DIAGNOSIS — J3089 Other allergic rhinitis: Secondary | ICD-10-CM | POA: Diagnosis not present

## 2021-12-28 DIAGNOSIS — J3081 Allergic rhinitis due to animal (cat) (dog) hair and dander: Secondary | ICD-10-CM | POA: Diagnosis not present

## 2022-01-05 DIAGNOSIS — J3089 Other allergic rhinitis: Secondary | ICD-10-CM | POA: Diagnosis not present

## 2022-01-05 DIAGNOSIS — J301 Allergic rhinitis due to pollen: Secondary | ICD-10-CM | POA: Diagnosis not present

## 2022-01-05 DIAGNOSIS — J3081 Allergic rhinitis due to animal (cat) (dog) hair and dander: Secondary | ICD-10-CM | POA: Diagnosis not present

## 2022-01-26 DIAGNOSIS — J301 Allergic rhinitis due to pollen: Secondary | ICD-10-CM | POA: Diagnosis not present

## 2022-01-26 DIAGNOSIS — J3089 Other allergic rhinitis: Secondary | ICD-10-CM | POA: Diagnosis not present

## 2022-01-26 DIAGNOSIS — J3081 Allergic rhinitis due to animal (cat) (dog) hair and dander: Secondary | ICD-10-CM | POA: Diagnosis not present

## 2022-02-02 DIAGNOSIS — J301 Allergic rhinitis due to pollen: Secondary | ICD-10-CM | POA: Diagnosis not present

## 2022-02-02 DIAGNOSIS — J3089 Other allergic rhinitis: Secondary | ICD-10-CM | POA: Diagnosis not present

## 2022-02-02 DIAGNOSIS — J3081 Allergic rhinitis due to animal (cat) (dog) hair and dander: Secondary | ICD-10-CM | POA: Diagnosis not present

## 2022-02-09 DIAGNOSIS — J3089 Other allergic rhinitis: Secondary | ICD-10-CM | POA: Diagnosis not present

## 2022-02-09 DIAGNOSIS — J301 Allergic rhinitis due to pollen: Secondary | ICD-10-CM | POA: Diagnosis not present

## 2022-02-09 DIAGNOSIS — J3081 Allergic rhinitis due to animal (cat) (dog) hair and dander: Secondary | ICD-10-CM | POA: Diagnosis not present

## 2022-02-23 DIAGNOSIS — J3081 Allergic rhinitis due to animal (cat) (dog) hair and dander: Secondary | ICD-10-CM | POA: Diagnosis not present

## 2022-02-23 DIAGNOSIS — J301 Allergic rhinitis due to pollen: Secondary | ICD-10-CM | POA: Diagnosis not present

## 2022-02-23 DIAGNOSIS — J3089 Other allergic rhinitis: Secondary | ICD-10-CM | POA: Diagnosis not present

## 2022-02-25 ENCOUNTER — Other Ambulatory Visit: Payer: Self-pay

## 2022-03-04 DIAGNOSIS — J301 Allergic rhinitis due to pollen: Secondary | ICD-10-CM | POA: Diagnosis not present

## 2022-03-04 DIAGNOSIS — J3089 Other allergic rhinitis: Secondary | ICD-10-CM | POA: Diagnosis not present

## 2022-03-04 DIAGNOSIS — J3081 Allergic rhinitis due to animal (cat) (dog) hair and dander: Secondary | ICD-10-CM | POA: Diagnosis not present

## 2022-03-09 DIAGNOSIS — J301 Allergic rhinitis due to pollen: Secondary | ICD-10-CM | POA: Diagnosis not present

## 2022-03-09 DIAGNOSIS — J3081 Allergic rhinitis due to animal (cat) (dog) hair and dander: Secondary | ICD-10-CM | POA: Diagnosis not present

## 2022-03-09 DIAGNOSIS — J3089 Other allergic rhinitis: Secondary | ICD-10-CM | POA: Diagnosis not present

## 2022-03-23 DIAGNOSIS — J301 Allergic rhinitis due to pollen: Secondary | ICD-10-CM | POA: Diagnosis not present

## 2022-03-23 DIAGNOSIS — J3089 Other allergic rhinitis: Secondary | ICD-10-CM | POA: Diagnosis not present

## 2022-03-23 DIAGNOSIS — J3081 Allergic rhinitis due to animal (cat) (dog) hair and dander: Secondary | ICD-10-CM | POA: Diagnosis not present

## 2022-04-01 DIAGNOSIS — H1045 Other chronic allergic conjunctivitis: Secondary | ICD-10-CM | POA: Diagnosis not present

## 2022-04-01 DIAGNOSIS — J3089 Other allergic rhinitis: Secondary | ICD-10-CM | POA: Diagnosis not present

## 2022-04-01 DIAGNOSIS — J3081 Allergic rhinitis due to animal (cat) (dog) hair and dander: Secondary | ICD-10-CM | POA: Diagnosis not present

## 2022-04-01 DIAGNOSIS — J301 Allergic rhinitis due to pollen: Secondary | ICD-10-CM | POA: Diagnosis not present

## 2022-04-06 DIAGNOSIS — J301 Allergic rhinitis due to pollen: Secondary | ICD-10-CM | POA: Diagnosis not present

## 2022-04-06 DIAGNOSIS — J3089 Other allergic rhinitis: Secondary | ICD-10-CM | POA: Diagnosis not present

## 2022-04-06 DIAGNOSIS — J3081 Allergic rhinitis due to animal (cat) (dog) hair and dander: Secondary | ICD-10-CM | POA: Diagnosis not present

## 2022-04-27 DIAGNOSIS — J3089 Other allergic rhinitis: Secondary | ICD-10-CM | POA: Diagnosis not present

## 2022-04-27 DIAGNOSIS — J3081 Allergic rhinitis due to animal (cat) (dog) hair and dander: Secondary | ICD-10-CM | POA: Diagnosis not present

## 2022-04-27 DIAGNOSIS — J301 Allergic rhinitis due to pollen: Secondary | ICD-10-CM | POA: Diagnosis not present

## 2022-05-02 DIAGNOSIS — J3081 Allergic rhinitis due to animal (cat) (dog) hair and dander: Secondary | ICD-10-CM | POA: Diagnosis not present

## 2022-05-02 DIAGNOSIS — J301 Allergic rhinitis due to pollen: Secondary | ICD-10-CM | POA: Diagnosis not present

## 2022-05-02 DIAGNOSIS — J3089 Other allergic rhinitis: Secondary | ICD-10-CM | POA: Diagnosis not present

## 2022-05-04 NOTE — Progress Notes (Signed)
37 y.o. G42P0000 Married Caucasian female here for annual exam.    Interested in tubal ligation.  Declines future childbearing.   Is currently using birth control pills.   Rare HSV outbreak.  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   PCP:   Dr. Volanda Napoleon.  Patient's last menstrual period was 02/16/2022.     Period Cycle (Days): 90 Period Pattern: (!) Irregular Menstrual Flow: Light Menstrual Control: Thin pad, Tampon Dysmenorrhea: None     Sexually active: Yes.    The current method of family planning is OCP (estrogen/progesterone).    Exercising: Yes.     Gym, strength training and cardio Smoker:  no  Health Maintenance: Pap:  03/07/18 Neg:Neg HR HPV, 03/31/17 Neg  History of abnormal Pap:  no MMG:  n/a Colonoscopy:  n/a BMD:   n/a  Result  n/a TDaP:  01/2020 Gardasil:   yes, completed HIV: 01/06/16 NR Hep C: 01/06/16 neg Screening Labs:  declines.    reports that she has never smoked. She has never used smokeless tobacco. She reports current alcohol use. She reports that she does not use drugs.  Past Medical History:  Diagnosis Date   HSV infection    Varicose veins of legs     Past Surgical History:  Procedure Laterality Date   ANTERIOR CRUCIATE LIGAMENT REPAIR Right 06/02/2021   MCL, ACL meniscus   CHOLECYSTECTOMY     ENDOVENOUS ABLATION SAPHENOUS VEIN W/ LASER Left 03/21/2019   endovenous laser ablation left greater saphenous vein and stab phlebectomy 10-20 incisions left leg by Gae Gallop MD     Current Outpatient Medications  Medication Sig Dispense Refill   cetirizine (ZYRTEC) 10 MG tablet TAKE ONE TABLET BY MOUTH ONCE DAILY     EPINEPHrine 0.3 mg/0.3 mL IJ SOAJ injection Inject 1 pen into the upper thigh as needed for systemic reactions as directed. 2 each 1   Levonorgestrel-Ethinyl Estradiol (DAYSEE) 0.15-0.03 &0.01 MG tablet Take 1 tablet by mouth daily. 91 tablet 3   ondansetron (ZOFRAN-ODT) 4 MG disintegrating tablet Dissolve 1 tablet by mouth every 6-8 hours  as needed. 22 tablet 0   valACYclovir (VALTREX) 1000 MG tablet TAKE 1 TABLET (1000 MG) BY MOUTH DAILY FOR 5 DAYS AS NEEDED. 30 tablet 1   No current facility-administered medications for this visit.    Family History  Problem Relation Age of Onset   Aortic aneurysm Father    Lung cancer Maternal Grandmother    Cancer Maternal Grandfather 71       prostate   Aortic aneurysm Cousin     Review of Systems  All other systems reviewed and are negative.   Exam:   BP 124/80 (BP Location: Right Arm, Patient Position: Sitting, Cuff Size: Normal)   Pulse 91   Ht 5' 7.5" (1.715 m)   Wt 200 lb (90.7 kg)   LMP 02/16/2022   SpO2 97%   BMI 30.86 kg/m     General appearance: alert, cooperative and appears stated age Head: normocephalic, without obvious abnormality, atraumatic Neck: no adenopathy, supple, symmetrical, trachea midline and thyroid normal to inspection and palpation Lungs: clear to auscultation bilaterally Breasts: normal appearance, no masses or tenderness, No nipple retraction or dimpling, No nipple discharge or bleeding, No axillary adenopathy Heart: regular rate and rhythm Abdomen: soft, non-tender; no masses, no organomegaly Extremities: extremities normal, atraumatic, no cyanosis or edema Skin: skin color, texture, turgor normal. No rashes or lesions Lymph nodes: cervical, supraclavicular, and axillary nodes normal. Neurologic: grossly normal  Pelvic: External  genitalia:  no lesions              No abnormal inguinal nodes palpated.              Urethra:  normal appearing urethra with no masses, tenderness or lesions              Bartholins and Skenes: normal                 Vagina: normal appearing vagina with normal color and discharge, no lesions              Cervix: no lesions              Pap taken: no Bimanual Exam:  Uterus:  normal size, contour, position, consistency, mobility, non-tender              Adnexa: no mass, fullness, tenderness              Chaperone was present for exam:  Raquel Sarna  Assessment:   Well woman visit with gynecologic exam. Using combined oral contraception.  Hx HSV, genital. Varicose veins.  Superficial thrombophlebitis.  No DVT.  Plan: Mammogram screening discussed. Self breast awareness reviewed. Pap and HR HPV 2025. Guidelines for Calcium, Vitamin D, regular exercise program including cardiovascular and weight bearing exercise. We discussed reversible and permanent contraception.  Options of pills, Nuva Ring, Annovera, Ortho Evra, Depo Provera, Nexplanon, IUDs, tubal ligation/salpingectomy, and vasectomy reviewed.  Will renew birth control pills for one year.  She will let me know if she would like referral to a surgical gynecologist for her to have permanent sterilization.  Written information given.  Refill of Valtrex to use prn.  Follow up annually and prn.   After visit summary provided.

## 2022-05-16 DIAGNOSIS — J3081 Allergic rhinitis due to animal (cat) (dog) hair and dander: Secondary | ICD-10-CM | POA: Diagnosis not present

## 2022-05-16 DIAGNOSIS — J301 Allergic rhinitis due to pollen: Secondary | ICD-10-CM | POA: Diagnosis not present

## 2022-05-16 DIAGNOSIS — J3089 Other allergic rhinitis: Secondary | ICD-10-CM | POA: Diagnosis not present

## 2022-05-18 ENCOUNTER — Other Ambulatory Visit (HOSPITAL_COMMUNITY): Payer: Self-pay

## 2022-05-18 ENCOUNTER — Other Ambulatory Visit: Payer: Self-pay

## 2022-05-18 ENCOUNTER — Ambulatory Visit (INDEPENDENT_AMBULATORY_CARE_PROVIDER_SITE_OTHER): Payer: 59 | Admitting: Obstetrics and Gynecology

## 2022-05-18 ENCOUNTER — Encounter: Payer: Self-pay | Admitting: Obstetrics and Gynecology

## 2022-05-18 VITALS — BP 124/80 | HR 91 | Ht 67.5 in | Wt 200.0 lb

## 2022-05-18 DIAGNOSIS — Z01419 Encounter for gynecological examination (general) (routine) without abnormal findings: Secondary | ICD-10-CM

## 2022-05-18 MED ORDER — VALACYCLOVIR HCL 1 G PO TABS
1000.0000 mg | ORAL_TABLET | Freq: Every day | ORAL | 1 refills | Status: AC
Start: 2022-05-18 — End: ?
  Filled 2022-05-18: qty 30, fill #0
  Filled 2022-08-14: qty 30, 30d supply, fill #0

## 2022-05-18 MED ORDER — LEVONORGEST-ETH ESTRAD 91-DAY 0.15-0.03 &0.01 MG PO TABS
1.0000 | ORAL_TABLET | Freq: Every day | ORAL | 3 refills | Status: DC
Start: 2022-05-18 — End: 2023-07-05
  Filled 2022-05-18: qty 91, 91d supply, fill #0
  Filled 2022-08-21: qty 91, 91d supply, fill #1
  Filled 2022-12-04 – 2023-01-03 (×3): qty 91, 91d supply, fill #2
  Filled 2023-01-04: qty 91, 91d supply, fill #3
  Filled 2023-04-10 (×2): qty 91, 91d supply, fill #2

## 2022-05-18 NOTE — Patient Instructions (Signed)
EXERCISE AND DIET:  We recommended that you start or continue a regular exercise program for good health. Regular exercise means any activity that makes your heart beat faster and makes you sweat.  We recommend exercising at least 30 minutes per day at least 3 days a week, preferably 4 or 5.  We also recommend a diet low in fat and sugar.  Inactivity, poor dietary choices and obesity can cause diabetes, heart attack, stroke, and kidney damage, among others.    ALCOHOL AND SMOKING:  Women should limit their alcohol intake to no more than 7 drinks/beers/glasses of wine (combined, not each!) per week. Moderation of alcohol intake to this level decreases your risk of breast cancer and liver damage. And of course, no recreational drugs are part of a healthy lifestyle.  And absolutely no smoking or even second hand smoke. Most people know smoking can cause heart and lung diseases, but did you know it also contributes to weakening of your bones? Aging of your skin?  Yellowing of your teeth and nails?  CALCIUM AND VITAMIN D:  Adequate intake of calcium and Vitamin D are recommended.  The recommendations for exact amounts of these supplements seem to change often, but generally speaking 600 mg of calcium (either carbonate or citrate) and 800 units of Vitamin D per day seems prudent. Certain women may benefit from higher intake of Vitamin D.  If you are among these women, your doctor will have told you during your visit.    PAP SMEARS:  Pap smears, to check for cervical cancer or precancers,  have traditionally been done yearly, although recent scientific advances have shown that most women can have pap smears less often.  However, every woman still should have a physical exam from her gynecologist every year. It will include a breast check, inspection of the vulva and vagina to check for abnormal growths or skin changes, a visual exam of the cervix, and then an exam to evaluate the size and shape of the uterus and  ovaries.  And after 35 years of age, a rectal exam is indicated to check for rectal cancers. We will also provide age appropriate advice regarding health maintenance, like when you should have certain vaccines, screening for sexually transmitted diseases, bone density testing, colonoscopy, mammograms, etc.   MAMMOGRAMS:  All women over 40 years old should have a yearly mammogram. Many facilities now offer a "3D" mammogram, which may cost around $50 extra out of pocket. If possible,  we recommend you accept the option to have the 3D mammogram performed.  It both reduces the number of women who will be called back for extra views which then turn out to be normal, and it is better than the routine mammogram at detecting truly abnormal areas.    COLONOSCOPY:  Colonoscopy to screen for colon cancer is recommended for all women at age 50.  We know, you hate the idea of the prep.  We agree, BUT, having colon cancer and not knowing it is worse!!  Colon cancer so often starts as a polyp that can be seen and removed at colonscopy, which can quite literally save your life!  And if your first colonoscopy is normal and you have no family history of colon cancer, most women don't have to have it again for 10 years.  Once every ten years, you can do something that may end up saving your life, right?  We will be happy to help you get it scheduled when you are ready.    Be sure to check your insurance coverage so you understand how much it will cost.  It may be covered as a preventative service at no cost, but you should check your particular policy.    Laparoscopic Tubal Ligation Laparoscopic tubal ligation is a procedure to close the fallopian tubes. This is done to prevent pregnancy. When the fallopian tubes are closed, the eggs that your ovaries release cannot enter the uterus, and sperm cannot reach the released eggs. You should not have this procedure if you want to get pregnant someday or if you are unsure about having  more children. Tell a health care provider about: Any allergies you have. All medicines you are taking, including vitamins, herbs, eye drops, creams, and over-the-counter medicines. Any problems you or family members have had with anesthetic medicines. Any blood disorders you have. Any surgeries you have had. Any medical conditions you have. Whether you are pregnant or may be pregnant. Any past pregnancies. What are the risks? Generally, this is a safe procedure. However, problems may occur, including: Infection. Bleeding. Injury to other organs in the abdomen. Side effects from anesthetic medicines. Failure of the procedure. This procedure can increase your risk of an ectopic pregnancy. This is a pregnancy in which a fertilized egg attaches to the outside of the uterus. What happens before the procedure? Staying hydrated Follow instructions from your health care provider about hydration, which may include: Up to 2 hours before the procedure - you may continue to drink clear liquids, such as water, clear fruit juice, black coffee, and plain tea. Eating and drinking restrictions Follow instructions from your health care provider about eating and drinking, which may include: 8 hours before the procedure - stop eating heavy meals or foods, such as meat, fried foods, or fatty foods. 6 hours before the procedure - stop eating light meals or foods, such as toast or cereal. 6 hours before the procedure - stop drinking milk or drinks that contain milk. 2 hours before the procedure - stop drinking clear liquids. Medicines Ask your health care provider about: Changing or stopping your regular medicines. This is especially important if you are taking diabetes medicines or blood thinners. Taking medicines such as aspirin and ibuprofen. These medicines can thin your blood. Do not take these medicines unless your health care provider tells you to take them. Taking over-the-counter medicines,  vitamins, herbs, and supplements. Surgery safety Ask your health care provider: How your surgery site will be marked. What steps will be taken to help prevent infection. These steps may include: Removing hair at the surgery site. Washing skin with a germ-killing soap. Taking antibiotic medicine. General instructions Do not use any products that contain nicotine or tobacco for at least 4 weeks before the procedure. These products include cigarettes, chewing tobacco, and vaping devices, such as e-cigarettes. If you need help quitting, ask your health care provider. Plan to have someone take you home from the hospital. If you will be going home right after the procedure, plan to have a responsible adult care for you for the time you are told. This is important. What happens during the procedure?     An IV will be inserted into one of your veins. You will be given one or more of the following: A medicine to help you relax (sedative). A medicine to numb the area (local anesthetic). A medicine to make you fall asleep (general anesthetic). A medicine that is injected into an area of your body to numb everything below the  injection site (regional anesthetic). Your bladder may be emptied with a small tube (catheter). If you have been given a general anesthetic, a tube will be put down your throat to help you breathe. Two small incisions will be made in your lower abdomen and near your belly button. Your abdomen will be inflated with a gas. This will let the surgeon see better and will give the surgeon room to work. A lighted tube with camera (laparoscope) will be inserted into your abdomen through one of the incisions. Small instruments will be inserted through the other incision. The fallopian tubes will be tied off, burned (cauterized), or blocked with a clip, ring, or clamp. A small portion in the center of each fallopian tube may be removed. The gas will be released from the abdomen. The  incisions will be closed with stitches (sutures). A bandage (dressing) will be placed over the incisions. The procedure may vary among health care providers and hospitals. What happens after the procedure? Your blood pressure, heart rate, breathing rate, and blood oxygen level will be monitored until you leave the hospital. You will be given medicine to help with pain, nausea, and vomiting as needed. You may have vaginal discharge after the procedure. You may need to wear a sanitary napkin. If you were given a sedative during the procedure, it can affect you for several hours. Do not drive or operate machinery until your health care provider says that it is safe. Summary Laparoscopic tubal ligation is a procedure that is done to prevent pregnancy. You should not have this procedure if you want to get pregnant someday or if you are unsure about having more children. The procedure is done using a thin, lighted tube (laparoscope) with a camera attached that will be inserted into your abdomen through an incision. After the procedure you will be given medicine to help with pain, nausea, and vomiting as needed. Plan to have someone take you home from the hospital. This information is not intended to replace advice given to you by your health care provider. Make sure you discuss any questions you have with your health care provider. Document Revised: 10/25/2019 Document Reviewed: 10/25/2019 Elsevier Patient Education  [employer].

## 2022-05-19 ENCOUNTER — Other Ambulatory Visit: Payer: Self-pay

## 2022-05-20 ENCOUNTER — Other Ambulatory Visit (HOSPITAL_COMMUNITY): Payer: Self-pay

## 2022-05-30 DIAGNOSIS — J3081 Allergic rhinitis due to animal (cat) (dog) hair and dander: Secondary | ICD-10-CM | POA: Diagnosis not present

## 2022-05-30 DIAGNOSIS — J301 Allergic rhinitis due to pollen: Secondary | ICD-10-CM | POA: Diagnosis not present

## 2022-05-30 DIAGNOSIS — J3089 Other allergic rhinitis: Secondary | ICD-10-CM | POA: Diagnosis not present

## 2022-06-13 DIAGNOSIS — J3089 Other allergic rhinitis: Secondary | ICD-10-CM | POA: Diagnosis not present

## 2022-06-13 DIAGNOSIS — J301 Allergic rhinitis due to pollen: Secondary | ICD-10-CM | POA: Diagnosis not present

## 2022-06-13 DIAGNOSIS — J3081 Allergic rhinitis due to animal (cat) (dog) hair and dander: Secondary | ICD-10-CM | POA: Diagnosis not present

## 2022-06-30 DIAGNOSIS — J3089 Other allergic rhinitis: Secondary | ICD-10-CM | POA: Diagnosis not present

## 2022-06-30 DIAGNOSIS — J301 Allergic rhinitis due to pollen: Secondary | ICD-10-CM | POA: Diagnosis not present

## 2022-06-30 DIAGNOSIS — J3081 Allergic rhinitis due to animal (cat) (dog) hair and dander: Secondary | ICD-10-CM | POA: Diagnosis not present

## 2022-07-20 DIAGNOSIS — J3089 Other allergic rhinitis: Secondary | ICD-10-CM | POA: Diagnosis not present

## 2022-07-20 DIAGNOSIS — J301 Allergic rhinitis due to pollen: Secondary | ICD-10-CM | POA: Diagnosis not present

## 2022-07-20 DIAGNOSIS — J3081 Allergic rhinitis due to animal (cat) (dog) hair and dander: Secondary | ICD-10-CM | POA: Diagnosis not present

## 2022-08-04 DIAGNOSIS — J3081 Allergic rhinitis due to animal (cat) (dog) hair and dander: Secondary | ICD-10-CM | POA: Diagnosis not present

## 2022-08-04 DIAGNOSIS — J3089 Other allergic rhinitis: Secondary | ICD-10-CM | POA: Diagnosis not present

## 2022-08-04 DIAGNOSIS — J301 Allergic rhinitis due to pollen: Secondary | ICD-10-CM | POA: Diagnosis not present

## 2022-08-14 ENCOUNTER — Other Ambulatory Visit (HOSPITAL_COMMUNITY): Payer: Self-pay

## 2022-08-15 ENCOUNTER — Other Ambulatory Visit (HOSPITAL_COMMUNITY): Payer: Self-pay

## 2022-08-15 ENCOUNTER — Other Ambulatory Visit: Payer: Self-pay

## 2022-08-15 MED ORDER — EPINEPHRINE 0.3 MG/0.3ML IJ SOAJ
INTRAMUSCULAR | 3 refills | Status: AC
  Filled 2022-08-15 (×2): qty 2, 30d supply, fill #0

## 2022-08-17 ENCOUNTER — Encounter: Payer: Self-pay | Admitting: Family Medicine

## 2022-08-17 ENCOUNTER — Ambulatory Visit (INDEPENDENT_AMBULATORY_CARE_PROVIDER_SITE_OTHER): Payer: 59 | Admitting: Family Medicine

## 2022-08-17 VITALS — BP 110/72 | HR 70 | Temp 97.3°F | Ht 67.0 in | Wt 196.6 lb

## 2022-08-17 DIAGNOSIS — J301 Allergic rhinitis due to pollen: Secondary | ICD-10-CM | POA: Diagnosis not present

## 2022-08-17 DIAGNOSIS — Z0000 Encounter for general adult medical examination without abnormal findings: Secondary | ICD-10-CM

## 2022-08-17 DIAGNOSIS — J3089 Other allergic rhinitis: Secondary | ICD-10-CM | POA: Diagnosis not present

## 2022-08-17 DIAGNOSIS — J3081 Allergic rhinitis due to animal (cat) (dog) hair and dander: Secondary | ICD-10-CM | POA: Diagnosis not present

## 2022-08-17 LAB — COMPREHENSIVE METABOLIC PANEL
ALT: 31 U/L (ref 0–35)
AST: 29 U/L (ref 0–37)
Albumin: 4.4 g/dL (ref 3.5–5.2)
Alkaline Phosphatase: 40 U/L (ref 39–117)
BUN: 16 mg/dL (ref 6–23)
CO2: 25 mEq/L (ref 19–32)
Calcium: 9.6 mg/dL (ref 8.4–10.5)
Chloride: 103 mEq/L (ref 96–112)
Creatinine, Ser: 0.86 mg/dL (ref 0.40–1.20)
GFR: 87.82 mL/min (ref >60.00)
Glucose, Bld: 75 mg/dL (ref 70–99)
Potassium: 4.2 mEq/L (ref 3.5–5.1)
Sodium: 135 mEq/L (ref 135–145)
Total Bilirubin: 0.5 mg/dL (ref 0.2–1.2)
Total Protein: 7.2 g/dL (ref 6.0–8.3)

## 2022-08-17 LAB — TSH: TSH: 2.2 u[IU]/mL (ref 0.35–5.50)

## 2022-08-17 LAB — LIPID PANEL
Cholesterol: 172 mg/dL (ref 0–200)
HDL: 51.9 mg/dL (ref >39.00)
LDL Cholesterol: 84 mg/dL (ref 0–99)
NonHDL: 119.76
Total CHOL/HDL Ratio: 3
Triglycerides: 177 mg/dL — ABNORMAL HIGH (ref 0.0–149.0)
VLDL: 35.4 mg/dL (ref 0.0–40.0)

## 2022-08-17 LAB — T4, FREE: Free T4: 0.71 ng/dL (ref 0.60–1.60)

## 2022-08-17 LAB — CBC WITH DIFFERENTIAL/PLATELET
Basophils Absolute: 0 10*3/uL (ref 0.0–0.1)
Basophils Relative: 0.7 % (ref 0.0–3.0)
Eosinophils Absolute: 0.1 10*3/uL (ref 0.0–0.7)
Eosinophils Relative: 1.1 % (ref 0.0–5.0)
HCT: 45.2 % (ref 36.0–46.0)
Hemoglobin: 15 g/dL (ref 12.0–15.0)
Lymphocytes Relative: 22.7 % (ref 12.0–46.0)
Lymphs Abs: 1.3 10*3/uL (ref 0.7–4.0)
MCHC: 33.3 g/dL (ref 30.0–36.0)
MCV: 96.1 fl (ref 78.0–100.0)
Monocytes Absolute: 0.6 10*3/uL (ref 0.1–1.0)
Monocytes Relative: 10.4 % (ref 3.0–12.0)
Neutro Abs: 3.6 10*3/uL (ref 1.4–7.7)
Neutrophils Relative %: 65.1 % (ref 43.0–77.0)
Platelets: 307 10*3/uL (ref 150.0–400.0)
RBC: 4.71 Mil/uL (ref 3.87–5.11)
RDW: 13.5 % (ref 11.5–15.5)
WBC: 5.6 10*3/uL (ref 4.0–10.5)

## 2022-08-17 LAB — HEMOGLOBIN A1C: Hgb A1c MFr Bld: 4.8 % (ref 4.6–6.5)

## 2022-08-17 NOTE — Progress Notes (Signed)
Established Patient Office Visit   Subjective  Patient ID: Emily Dudley, female    DOB: [birth year]  Age: 37 y.o. MRN: [id number]  Chief Complaint  Patient presents with   Annual Exam    Pt is a 37 yo female seen for CPE.  Patient states she is doing well overall.  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    Past Medical History:  Diagnosis Date   HSV infection    Varicose veins of legs    Past Surgical History:  Procedure Laterality Date   ANTERIOR CRUCIATE LIGAMENT REPAIR Right 06/02/2021   MCL, ACL meniscus   CHOLECYSTECTOMY     ENDOVENOUS ABLATION SAPHENOUS VEIN W/ LASER Left 03/21/2019   endovenous laser ablation left greater saphenous vein and stab phlebectomy 10-20 incisions left leg by Cari Caraway MD    Social History   Tobacco Use   Smoking status: Never   Smokeless tobacco: Never  Vaping Use   Vaping Use: Never used  Substance Use Topics   Alcohol use: Yes    Comment: socially   Drug use: Never   Family History  Problem Relation Age of Onset   Aortic aneurysm Father    Lung cancer Maternal Grandmother    Cancer Maternal Grandfather 55       prostate   Aortic aneurysm Cousin    No Known Allergies    ROS Negative unless stated above    Objective:     BP 110/72 (BP Location: Left Arm, Patient Position: Sitting, Cuff Size: Normal)   Pulse 70   Temp (!) 97.3 F (36.3 C) (Temporal)   Ht 5\' 7"  (1.702 m)   Wt 196 lb 9.6 oz (89.2 kg)   SpO2 98%   BMI 30.79 kg/m    Physical Exam Constitutional:      Appearance: Normal appearance.  HENT:     Head: Normocephalic and atraumatic.     Right Ear: Tympanic membrane, ear canal and external ear normal.     Left Ear: Tympanic membrane, ear canal and external ear normal.     Nose: Nose normal.     Mouth/Throat:     Mouth: Mucous membranes are moist.     Pharynx: No oropharyngeal exudate or posterior oropharyngeal erythema.  Eyes:     General: No  scleral icterus.    Extraocular Movements: Extraocular movements intact.     Conjunctiva/sclera: Conjunctivae normal.     Pupils: Pupils are equal, round, and reactive to light.  Neck:     Thyroid: No thyromegaly.  Cardiovascular:     Rate and Rhythm: Normal rate and regular rhythm.     Pulses: Normal pulses.     Heart sounds: Normal heart sounds. No murmur heard.    No friction rub.  Pulmonary:     Effort: Pulmonary effort is normal.     Breath sounds: Normal breath sounds. No wheezing, rhonchi or rales.  Abdominal:     General: Bowel sounds are normal.     Palpations: Abdomen is soft.     Tenderness: There is no abdominal tenderness.  Musculoskeletal:        General: No deformity. Normal range of motion.  Lymphadenopathy:     Cervical: No cervical adenopathy.  Skin:    General: Skin is warm and dry.     Findings: No lesion.  Neurological:     General: No focal deficit present.     Mental Status: She is  alert and oriented to person, place, and time.  Psychiatric:        Mood and Affect: Mood normal.        Thought Content: Thought content normal.      No results found for any visits on 08/17/22.    Assessment & Plan:  Well adult exam -     CBC with Differential/Platelet -     TSH -     T4, free -     Hemoglobin A1c -     Lipid panel -     Comprehensive metabolic panel  Age-appropriate health screenings discussed.  Immunizations reviewed and up-to-date.  Pap with OB/GYN, due 2025.  Return in about 1 year (around 08/17/2023) for physical.   Deeann Saint, MD

## 2022-08-22 ENCOUNTER — Other Ambulatory Visit: Payer: Self-pay

## 2022-08-29 DIAGNOSIS — J3081 Allergic rhinitis due to animal (cat) (dog) hair and dander: Secondary | ICD-10-CM | POA: Diagnosis not present

## 2022-08-29 DIAGNOSIS — J3089 Other allergic rhinitis: Secondary | ICD-10-CM | POA: Diagnosis not present

## 2022-08-29 DIAGNOSIS — J301 Allergic rhinitis due to pollen: Secondary | ICD-10-CM | POA: Diagnosis not present

## 2022-09-13 DIAGNOSIS — J301 Allergic rhinitis due to pollen: Secondary | ICD-10-CM | POA: Diagnosis not present

## 2022-09-13 DIAGNOSIS — J3089 Other allergic rhinitis: Secondary | ICD-10-CM | POA: Diagnosis not present

## 2022-09-13 DIAGNOSIS — J3081 Allergic rhinitis due to animal (cat) (dog) hair and dander: Secondary | ICD-10-CM | POA: Diagnosis not present

## 2022-12-04 ENCOUNTER — Other Ambulatory Visit (HOSPITAL_COMMUNITY): Payer: Self-pay

## 2022-12-05 ENCOUNTER — Other Ambulatory Visit: Payer: Self-pay

## 2022-12-05 ENCOUNTER — Other Ambulatory Visit (HOSPITAL_COMMUNITY): Payer: Self-pay

## 2022-12-06 ENCOUNTER — Other Ambulatory Visit: Payer: Self-pay

## 2022-12-07 ENCOUNTER — Other Ambulatory Visit: Payer: Self-pay

## 2022-12-20 ENCOUNTER — Other Ambulatory Visit (HOSPITAL_COMMUNITY): Payer: Self-pay

## 2022-12-21 ENCOUNTER — Other Ambulatory Visit: Payer: Self-pay

## 2022-12-21 ENCOUNTER — Other Ambulatory Visit (HOSPITAL_COMMUNITY): Payer: Self-pay

## 2022-12-21 ENCOUNTER — Encounter: Payer: Self-pay | Admitting: Pharmacist

## 2022-12-22 ENCOUNTER — Other Ambulatory Visit: Payer: Self-pay

## 2023-01-03 ENCOUNTER — Other Ambulatory Visit: Payer: Self-pay

## 2023-01-03 ENCOUNTER — Other Ambulatory Visit (HOSPITAL_COMMUNITY): Payer: Self-pay

## 2023-01-04 ENCOUNTER — Other Ambulatory Visit (HOSPITAL_COMMUNITY): Payer: Self-pay

## 2023-01-09 ENCOUNTER — Other Ambulatory Visit (HOSPITAL_COMMUNITY): Payer: Self-pay

## 2023-01-11 ENCOUNTER — Other Ambulatory Visit (HOSPITAL_COMMUNITY): Payer: Self-pay

## 2023-04-10 ENCOUNTER — Other Ambulatory Visit (HOSPITAL_COMMUNITY): Payer: Self-pay

## 2023-07-05 ENCOUNTER — Other Ambulatory Visit (HOSPITAL_COMMUNITY)
Admission: RE | Admit: 2023-07-05 | Discharge: 2023-07-05 | Disposition: A | Discharge status: Home / Self Care | Source: Ambulatory Visit | Attending: Radiology | Admitting: Radiology

## 2023-07-05 ENCOUNTER — Other Ambulatory Visit (HOSPITAL_COMMUNITY): Payer: Self-pay

## 2023-07-05 ENCOUNTER — Encounter: Payer: Self-pay | Admitting: Radiology

## 2023-07-05 ENCOUNTER — Other Ambulatory Visit: Payer: Self-pay

## 2023-07-05 ENCOUNTER — Ambulatory Visit (INDEPENDENT_AMBULATORY_CARE_PROVIDER_SITE_OTHER): Payer: Self-pay | Admitting: Radiology

## 2023-07-05 VITALS — BP 104/68 | Ht 67.0 in | Wt 151.0 lb

## 2023-07-05 DIAGNOSIS — Z01419 Encounter for gynecological examination (general) (routine) without abnormal findings: Secondary | ICD-10-CM | POA: Diagnosis not present

## 2023-07-05 DIAGNOSIS — Z1331 Encounter for screening for depression: Secondary | ICD-10-CM | POA: Diagnosis not present

## 2023-07-05 DIAGNOSIS — Z3041 Encounter for surveillance of contraceptive pills: Secondary | ICD-10-CM

## 2023-07-05 MED ORDER — LEVONORGEST-ETH ESTRAD 91-DAY 0.15-0.03 &0.01 MG PO TABS
1.0000 | ORAL_TABLET | Freq: Every day | ORAL | 4 refills | Status: AC
  Filled 2023-07-05: qty 91, 91d supply, fill #0
  Filled 2023-10-03: qty 91, 91d supply, fill #1
  Filled 2024-02-01: qty 91, 91d supply, fill #2

## 2023-07-05 NOTE — Patient Instructions (Signed)

## 2023-07-05 NOTE — Progress Notes (Signed)
 Emily Dudley Apr 07, 1985 [id number]   History:  38 y.o. G0 presents for annual exam.No gyn concerns. Happy on OCPs. Travel nurse.   Gynecologic History Patient's last menstrual period was 06/15/2023 (exact date). Period Cycle (Days):  (periods every 3 months on continuous COCs) Period Duration (Days): 3 Period Pattern: Regular Menstrual Flow: Light Menstrual Control: Tampon, Thin pad Dysmenorrhea: (!) Mild Dysmenorrhea Symptoms: Cramping Contraception/Family planning: OCP (estrogen/progesterone) Sexually active: yes Last Pap: 2020. Results were: normal   Obstetric History OB History  Gravida Para Term Preterm AB Living  0 0 0 0 0 0  SAB IAB Ectopic Multiple Live Births  0 0 0 0 0       07/05/2023    1:38 PM 08/17/2022   10:17 AM 08/12/2021    8:13 AM  Depression screen PHQ 2/9  Decreased Interest 0 0 0  Down, Depressed, Hopeless 0 0 0  PHQ - 2 Score 0 0 0  Altered sleeping  0 0  Tired, decreased energy  0 1  Change in appetite  0 0  Feeling bad or failure about yourself   0 1  Trouble concentrating  0 0  Moving slowly or fidgety/restless  0 0  Suicidal thoughts  0 0  PHQ-9 Score  0 2  Difficult doing work/chores   Not difficult at all     The following portions of the patient's history were reviewed and updated as appropriate: allergies, current medications, past family history, past medical history, past social history, past surgical history, and problem list.  Review of Systems  All other systems reviewed and are negative.   Past medical history, past surgical history, family history and social history were all reviewed and documented in the EPIC chart.  Exam:  Vitals:   07/05/23 1338  BP: 104/68  Weight: 151 lb (68.5 kg)  Height: 5\' 7"  (1.702 m)   Body mass index is 23.65 kg/m.  Physical Exam Vitals and nursing note reviewed. Exam conducted with a chaperone present.  Constitutional:      Appearance: Normal appearance. She is normal weight.   HENT:     Head: Normocephalic and atraumatic.  Neck:     Thyroid : No thyroid  mass, thyromegaly or thyroid  tenderness.  Cardiovascular:     Rate and Rhythm: Regular rhythm.     Heart sounds: Normal heart sounds.  Pulmonary:     Effort: Pulmonary effort is normal.     Breath sounds: Normal breath sounds.  Chest:  Breasts:    Breasts are symmetrical.     Right: Normal. No inverted nipple, mass, nipple discharge, skin change or tenderness.     Left: Normal. No inverted nipple, mass, nipple discharge, skin change or tenderness.  Abdominal:     General: Abdomen is flat. Bowel sounds are normal.     Palpations: Abdomen is soft.  Genitourinary:    General: Normal vulva.     Vagina: Normal. No vaginal discharge, bleeding or lesions.     Cervix: Normal. No discharge or lesion.     Uterus: Normal. Not enlarged and not tender.      Adnexa: Right adnexa normal and left adnexa normal.       Right: No mass, tenderness or fullness.         Left: No mass, tenderness or fullness.    Lymphadenopathy:     Upper Body:     Right upper body: No axillary adenopathy.     Left upper body: No axillary adenopathy.  Skin:  General: Skin is warm and dry.  Neurological:     Mental Status: She is alert and oriented to person, place, and time.  Psychiatric:        Mood and Affect: Mood normal.        Thought Content: Thought content normal.        Judgment: Judgment normal.      Ellis Guys, CMA present for exam  Assessment/Plan:   1. Well woman exam with routine gynecological exam (Primary) - Cytology - PAP( [facility])  2. Depression screening negative  3. Oral contraceptive pill surveillance - Levonorgestrel-Ethinyl Estradiol (DAYSEE ) 0.15-0.03 &0.01 MG tablet; Take 1 tablet by mouth daily.  Dispense: 91 tablet; Refill: 4     Return in about 1 year (around 07/04/2024) for Annual.  Synetta Eves B WHNP-BC 1:59 PM 07/05/2023

## 2023-07-07 ENCOUNTER — Other Ambulatory Visit: Payer: Self-pay | Admitting: Nurse Practitioner

## 2023-07-07 ENCOUNTER — Other Ambulatory Visit (HOSPITAL_COMMUNITY): Payer: Self-pay

## 2023-07-07 ENCOUNTER — Other Ambulatory Visit: Payer: Self-pay

## 2023-07-07 ENCOUNTER — Ambulatory Visit: Payer: Self-pay | Admitting: Nurse Practitioner

## 2023-07-07 DIAGNOSIS — B9689 Other specified bacterial agents as the cause of diseases classified elsewhere: Secondary | ICD-10-CM

## 2023-07-07 LAB — CYTOLOGY - PAP
Adequacy: ABSENT
Comment: NEGATIVE
Diagnosis: NEGATIVE
High risk HPV: NEGATIVE

## 2023-07-07 MED ORDER — METRONIDAZOLE 500 MG PO TABS
500.0000 mg | ORAL_TABLET | Freq: Two times a day (BID) | ORAL | 0 refills | Status: DC
  Filled 2023-07-07 (×2): qty 14, 7d supply, fill #0

## 2023-07-22 ENCOUNTER — Other Ambulatory Visit (HOSPITAL_COMMUNITY): Payer: Self-pay

## 2023-07-22 ENCOUNTER — Telehealth: Admitting: Physician Assistant

## 2023-07-22 DIAGNOSIS — L255 Unspecified contact dermatitis due to plants, except food: Secondary | ICD-10-CM

## 2023-07-22 MED ORDER — PREDNISONE 10 MG PO TABS
ORAL_TABLET | ORAL | 0 refills | Status: DC
  Filled 2023-07-22: qty 37, 14d supply, fill #0

## 2023-07-22 NOTE — Progress Notes (Signed)
 Virtual Visit Consent   Emily Dudley, you are scheduled for a virtual visit with a [facility] provider today. Just as with appointments in the office, your consent must be obtained to participate. Your consent will be active for this visit and any virtual visit you may have with one of our providers in the next 365 days. If you have a MyChart account, a copy of this consent can be sent to you electronically.  As this is a virtual visit, video technology does not allow for your provider to perform a traditional examination. This may limit your provider's ability to fully assess your condition. If your provider identifies any concerns that need to be evaluated in person or the need to arrange testing (such as labs, EKG, etc.), we will make arrangements to do so. Although advances in technology are sophisticated, we cannot ensure that it will always work on either your end or our end. If the connection with a video visit is poor, the visit may have to be switched to a telephone visit. With either a video or telephone visit, we are not always able to ensure that we have a secure connection.  By engaging in this virtual visit, you consent to the provision of healthcare and authorize for your insurance to be billed (if applicable) for the services provided during this visit. Depending on your insurance coverage, you may receive a charge related to this service.  I need to obtain your verbal consent now. Are you willing to proceed with your visit today? [name] has provided verbal consent on 07/22/2023 for a virtual visit (video or telephone). Angelia Kelp, PA-C  Date: 07/22/2023 1:09 PM   Virtual Visit via Video Note   I, Angelia Kelp, connected with  Emily Dudley  ([id number], Jul 15, 1985) on 07/22/23 at  1:00 PM EDT by a video-enabled telemedicine application and verified that I am speaking with the correct person using two identifiers.  Location: Patient: Virtual  Visit Location Patient: Home Provider: Virtual Visit Location Provider: Home Office   I discussed the limitations of evaluation and management by telemedicine and the availability of in person appointments. The patient expressed understanding and agreed to proceed.    History of Present Illness: Emily Dudley is a 38 y.o. who identifies as a female who was assigned female at birth, and is being seen today for poison oak dermatitis.  HPI: Poison James Mcardle This is a new problem. The current episode started 1 to 4 weeks ago (on 07/11/23). The problem is unchanged. The affected locations include the left upper leg and right upper leg. The rash is characterized by blistering, itchiness and redness. She was exposed to plant contact. Pertinent negatives include no congestion, cough, facial edema, fatigue, fever or shortness of breath. Treatments tried: benadryl, pepcid, hydrocortisone. The treatment provided no relief.     Problems:  Patient Active Problem List   Diagnosis Date Noted   Tear of lateral meniscus of knee 05/18/2021   Rupture of anterior cruciate ligament 05/14/2021   Hamstring injury 09/16/2019   Family history of aortic aneurysm 04/03/2019   Chronic venous insufficiency 10/16/2018   Environmental and seasonal allergies 07/05/2017   HSV infection 07/05/2017    Allergies: No Known Allergies Medications:  Current Outpatient Medications:    predniSONE (DELTASONE) 10 MG tablet, Days 1-4 take 4 tablets (40 mg) daily  Days 5-8 take 3 tablets (30 mg) daily, Days 9-11 take 2 tablets (20 mg) daily, Days 12-14 take 1 tablet (  10 mg) daily., Disp: 37 tablet, Rfl: 0   cetirizine (ZYRTEC) 10 MG tablet, TAKE ONE TABLET BY MOUTH ONCE DAILY, Disp: , Rfl:    EPINEPHrine  0.3 mg/0.3 mL IJ SOAJ injection, Inject 1 pen into the muscle as directed as needed for systemic allergic reactions (Patient not taking: Reported on 07/05/2023), Disp: 2 each, Rfl: 3   Levonorgestrel-Ethinyl Estradiol (DAYSEE )  0.15-0.03 &0.01 MG tablet, Take 1 tablet by mouth daily., Disp: 91 tablet, Rfl: 4   metroNIDAZOLE  (FLAGYL ) 500 MG tablet, Take 1 tablet (500 mg total) by mouth 2 (two) times daily., Disp: 14 tablet, Rfl: 0   NONFORMULARY OR COMPOUNDED ITEM, Compounded Semaglutide 20 units 5mg /25ml, Disp: , Rfl:    ondansetron  (ZOFRAN -ODT) 4 MG disintegrating tablet, Dissolve 1 tablet by mouth every 6-8 hours as needed., Disp: 22 tablet, Rfl: 0   valACYclovir  (VALTREX ) 1000 MG tablet, Take 1 tablet (1,000 mg total) by mouth daily for 5 days as needed, Disp: 30 tablet, Rfl: 1  Observations/Objective: Patient is well-developed, well-nourished in no acute distress.  Resting comfortably at home.  Head is normocephalic, atraumatic.  No labored breathing.  Speech is clear and coherent with logical content.  Patient is alert and oriented at baseline.  Deep red to violet rash in upper, medial thighs bilaterally  Assessment and Plan: 1. Dermatitis due to plants, including poison ivy, sumac, and oak (Primary) - predniSONE (DELTASONE) 10 MG tablet; Days 1-4 take 4 tablets (40 mg) daily  Days 5-8 take 3 tablets (30 mg) daily, Days 9-11 take 2 tablets (20 mg) daily, Days 12-14 take 1 tablet (10 mg) daily.  Dispense: 37 tablet; Refill: 0  - Plant exposure (poison ivy/poison oak/poison sumac) with rash - Will prescribe Prednisone 2 weeks taper - May use topical Hydrocortisone cream, benadryl cream, and/or calamine lotion for itching - Cool compresses - Luke warm to cool showers - Seek in person evaluation if rash continues to spread or if any appear to become infected   Follow Up Instructions: I discussed the assessment and treatment plan with the patient. The patient was provided an opportunity to ask questions and all were answered. The patient agreed with the plan and demonstrated an understanding of the instructions.  A copy of instructions were sent to the patient via MyChart unless otherwise noted below.    The  patient was advised to call back or seek an in-person evaluation if the symptoms worsen or if the condition fails to improve as anticipated.    Angelia Kelp, PA-C

## 2023-07-22 NOTE — Patient Instructions (Signed)
 [employer], thank you for joining Angelia Kelp, PA-C for today's virtual visit.  While this provider is not your primary care provider (PCP), if your PCP is located in our provider database this encounter information will be shared with them immediately following your visit.   A [facility] MyChart account gives you access to today's visit and all your visits, tests, and labs performed at [facility] " click here if you don't have a [facility] MyChart account or go to [email or web address]  Consent: (Patient) Contractor provided verbal consent for this virtual visit at the beginning of the encounter.  Current Medications:  Current Outpatient Medications:    predniSONE (DELTASONE) 10 MG tablet, Days 1-4 take 4 tablets (40 mg) daily  Days 5-8 take 3 tablets (30 mg) daily, Days 9-11 take 2 tablets (20 mg) daily, Days 12-14 take 1 tablet (10 mg) daily., Disp: 37 tablet, Rfl: 0   cetirizine (ZYRTEC) 10 MG tablet, TAKE ONE TABLET BY MOUTH ONCE DAILY, Disp: , Rfl:    EPINEPHrine  0.3 mg/0.3 mL IJ SOAJ injection, Inject 1 pen into the muscle as directed as needed for systemic allergic reactions (Patient not taking: Reported on 07/05/2023), Disp: 2 each, Rfl: 3   Levonorgestrel-Ethinyl Estradiol (DAYSEE ) 0.15-0.03 &0.01 MG tablet, Take 1 tablet by mouth daily., Disp: 91 tablet, Rfl: 4   metroNIDAZOLE  (FLAGYL ) 500 MG tablet, Take 1 tablet (500 mg total) by mouth 2 (two) times daily., Disp: 14 tablet, Rfl: 0   NONFORMULARY OR COMPOUNDED ITEM, Compounded Semaglutide 20 units 5mg /12ml, Disp: , Rfl:    ondansetron  (ZOFRAN -ODT) 4 MG disintegrating tablet, Dissolve 1 tablet by mouth every 6-8 hours as needed., Disp: 22 tablet, Rfl: 0   valACYclovir  (VALTREX ) 1000 MG tablet, Take 1 tablet (1,000 mg total) by mouth daily for 5 days as needed, Disp: 30 tablet, Rfl: 1   Medications ordered in this encounter:  Meds ordered this encounter  Medications   predniSONE (DELTASONE)  10 MG tablet    Sig: Days 1-4 take 4 tablets (40 mg) daily  Days 5-8 take 3 tablets (30 mg) daily, Days 9-11 take 2 tablets (20 mg) daily, Days 12-14 take 1 tablet (10 mg) daily.    Dispense:  37 tablet    Refill:  0    Supervising Provider:   Corine Dice [[id number]]     *If you need refills on other medications prior to your next appointment, please contact your pharmacy*  Follow-Up: Call back or seek an in-person evaluation if the symptoms worsen or if the condition fails to improve as anticipated.   Virtual Care [phone]  Other Instructions  Poison Oak Dermatitis  Poison oak dermatitis is inflammation of the skin that is caused by contact with the chemicals in the leaves of the poison oak (Toxicodendron) plant. The skin reaction often includes redness, swelling, blisters, and extreme itching. What are the causes? This condition is caused by a specific chemical (urushiol) that is found in the sap of the poison oak plant. This chemical is sticky and can be easily spread to people, animals, and objects. You can get poison oak dermatitis by: Having direct contact with a poison oak plant. Touching animals, other people, or objects that have come in contact with poison oak and have the chemical on them. What increases the risk? This condition is more likely to develop in people who: Are outdoors often in wooded or [place] areas. Go outdoors without wearing protective clothing, such as closed shoes,  long pants, and a long-sleeved shirt. What are the signs or symptoms? Symptoms of this condition include: Redness of the skin. Extreme itching. A rash that often includes bumps and blisters. The rash usually appears 48 hours after exposure if you have been exposed before. If this is the first time you have been exposed, the rash may not appear until a week after exposure. Swelling. This may occur if the reaction is more severe. Symptoms usually last for 1-2 weeks. However,  the first time you develop this condition, symptoms may last 3-4 weeks. How is this diagnosed? This condition may be diagnosed based on your symptoms and a physical exam. Your health care provider may also ask you about any recent outdoor activity. How is this treated? Treatment for this condition will vary depending on how severe it is. Treatment may include: Hydrocortisone creams or calamine lotions to relieve itching. Oatmeal baths to soothe the skin. Over-the-counter antihistamine medicines to help reduce itching. Steroid medicine taken by mouth (orally) for more severe reactions. Follow these instructions at home: Medicines Take or apply over-the-counter and prescription medicines only as told by your health care provider. Use hydrocortisone creams or calamine lotion as needed to soothe the skin and relieve itching. General instructions Do not scratch or rub your skin. Apply a cold, wet cloth (cold compress) to the affected areas or take baths in cool water. This will help with itching. Avoid hot baths and showers. Take oatmeal baths as needed. Use colloidal oatmeal. You can get this at your local pharmacy or grocery store. Follow the instructions on the packaging. Wash clothes, bedsheets, towels, and blankets that you wore or came in contact with between your exposure to the plant and the appearance of your rash. The oils can remain on these items and continue to cause new exposure. Check the affected area every day for signs of infection. Check for: More redness, swelling, or pain. Fluid or blood. Warmth. Pus or a bad smell. Keep all follow-up visits. Your health care provider may want to see how your skin is progressing with treatment. How is this prevented?  Learn to identify the poison oak plant and avoid contact with the plant. This plant can be recognized by the number of leaves. Generally, poison oak has three leaves with flowering branches on a single stem. The leaves are  often a bit fuzzy and have a toothlike edge. If you have been exposed to poison oak, thoroughly wash your skin with soap and water right away. You have about 30 minutes to remove the plant resin before it will cause the rash. Be sure to wash under your fingernails because any plant resin there will continue to spread the rash. When hiking or camping, wear clothes that will help you avoid exposure on the skin. This includes long pants, a long-sleeved shirt, long socks, and hiking boots. You can also apply preventive lotion to your skin to help limit exposure. If you suspect that your clothes or outdoor gear came in contact with poison oak, rinse them off outside with a garden hose before bringing them inside your house. When doing yard work or gardening, wear gloves, long sleeves, long pants, and boots. Wash your garden tools and gloves if they come in contact with poison oak. If you suspect that your pet has come into contact with poison oak, wash them with pet shampoo and water. Make sure you wear gloves while washing your pet. Do not burn poison oak plants. This can release the chemical from  the plant into the air and may cause a reaction on the skin or eyes, or in the lungs from breathing in the smoke. Contact a health care provider if: You have open sores in the rash area. You have any signs of infection. You have redness that spreads beyond the rash area. You have a fever. You have a rash over a large area of your body. You have a rash on your eyes, mouth, or genitals. You have a rash that does not improve after a few weeks. Get help right away if: Your face swells or your eyes swell shut. You have trouble breathing. You have trouble swallowing. These symptoms may be an emergency. Get help right away. Call 911. Do not wait to see if the symptoms will go away. Do not drive yourself to the hospital. This information is not intended to replace advice given to you by your health care provider.  Make sure you discuss any questions you have with your health care provider. Document Revised: 08/18/2021 Document Reviewed: 07/08/2021 Elsevier Patient Education  2024 Elsevier Inc.   If you have been instructed to have an in-person evaluation today at a local Urgent Care facility, please use the link below. It will take you to a list of all of our available [facility], including address, phone number and hours of operation. Please do not delay care.  [facility]  If you or a family member do not have a primary care provider, use the link below to schedule a visit and establish care. When you choose a [facility] primary care physician or advanced practice provider, you gain a long-term partner in health. Find a Primary Care Provider  Learn more about [facility] in-office and virtual care options: [facility] - Get Care Now

## 2023-10-03 ENCOUNTER — Other Ambulatory Visit (HOSPITAL_COMMUNITY): Payer: Self-pay

## 2023-10-04 ENCOUNTER — Other Ambulatory Visit (HOSPITAL_COMMUNITY): Payer: Self-pay

## 2023-10-04 ENCOUNTER — Ambulatory Visit: Payer: Self-pay | Admitting: Family Medicine

## 2023-10-04 ENCOUNTER — Encounter: Payer: Self-pay | Admitting: Family Medicine

## 2023-10-04 VITALS — BP 98/62 | HR 81 | Temp 98.4°F | Ht 67.0 in | Wt 146.2 lb

## 2023-10-04 DIAGNOSIS — Z0000 Encounter for general adult medical examination without abnormal findings: Secondary | ICD-10-CM

## 2023-10-04 LAB — CBC WITH DIFFERENTIAL/PLATELET
Basophils Absolute: 0 K/uL (ref 0.0–0.1)
Basophils Relative: 0.7 % (ref 0.0–3.0)
Eosinophils Absolute: 0.1 K/uL (ref 0.0–0.7)
Eosinophils Relative: 1.1 % (ref 0.0–5.0)
HCT: 44.9 % (ref 36.0–46.0)
Hemoglobin: 15 g/dL (ref 12.0–15.0)
Lymphocytes Relative: 28.7 % (ref 12.0–46.0)
Lymphs Abs: 1.7 K/uL (ref 0.7–4.0)
MCHC: 33.5 g/dL (ref 30.0–36.0)
MCV: 95 fl (ref 78.0–100.0)
Monocytes Absolute: 0.5 K/uL (ref 0.1–1.0)
Monocytes Relative: 8.6 % (ref 3.0–12.0)
Neutro Abs: 3.7 K/uL (ref 1.4–7.7)
Neutrophils Relative %: 60.9 % (ref 43.0–77.0)
Platelets: 292 K/uL (ref 150.0–400.0)
RBC: 4.72 Mil/uL (ref 3.87–5.11)
RDW: 13.3 % (ref 11.5–15.5)
WBC: 6.1 K/uL (ref 4.0–10.5)

## 2023-10-04 LAB — TSH: TSH: 2.42 u[IU]/mL (ref 0.35–5.50)

## 2023-10-04 LAB — COMPREHENSIVE METABOLIC PANEL WITH GFR
ALT: 26 U/L (ref 0–35)
AST: 27 U/L (ref 0–37)
Albumin: 4.3 g/dL (ref 3.5–5.2)
Alkaline Phosphatase: 30 U/L — ABNORMAL LOW (ref 39–117)
BUN: 12 mg/dL (ref 6–23)
CO2: 26 meq/L (ref 19–32)
Calcium: 8.9 mg/dL (ref 8.4–10.5)
Chloride: 102 meq/L (ref 96–112)
Creatinine, Ser: 0.87 mg/dL (ref 0.40–1.20)
GFR: 85.92 mL/min (ref >60.00)
Glucose, Bld: 86 mg/dL (ref 70–99)
Potassium: 4.1 meq/L (ref 3.5–5.1)
Sodium: 136 meq/L (ref 135–145)
Total Bilirubin: 0.8 mg/dL (ref 0.2–1.2)
Total Protein: 7.1 g/dL (ref 6.0–8.3)

## 2023-10-04 LAB — T4, FREE: Free T4: 0.84 ng/dL (ref 0.60–1.60)

## 2023-10-04 LAB — LIPID PANEL
Cholesterol: 157 mg/dL (ref 0–200)
HDL: 58 mg/dL (ref >39.00)
LDL Cholesterol: 74 mg/dL (ref 0–99)
NonHDL: 98.63
Total CHOL/HDL Ratio: 3
Triglycerides: 122 mg/dL (ref 0.0–149.0)
VLDL: 24.4 mg/dL (ref 0.0–40.0)

## 2023-10-04 LAB — HEMOGLOBIN A1C: Hgb A1c MFr Bld: 5 % (ref 4.6–6.5)

## 2023-10-04 MED ORDER — ONDANSETRON 4 MG PO TBDP
ORAL_TABLET | ORAL | 0 refills | Status: AC
  Filled 2023-10-04 (×2): qty 90, 57d supply, fill #0

## 2023-10-04 NOTE — Progress Notes (Signed)
 Established Patient Office Visit   Subjective  Patient ID: Emily Dudley, female    DOB: Feb 05, 1986  Age: 38 y.o. MRN: [id number]  Chief Complaint  Patient presents with   Annual Exam    Pt is a 38 yo female seen for CPE.  Pt is doing well.  Still working as a travel Engineer, civil (consulting).  She and her husband are currently in [place]. Requesting refill on zofran  prn.    Patient Active Problem List   Diagnosis Date Noted   Tear of lateral meniscus of knee 05/18/2021   Rupture of anterior cruciate ligament 05/14/2021   Hamstring injury 09/16/2019   Family history of aortic aneurysm 04/03/2019   Chronic venous insufficiency 10/16/2018   Environmental and seasonal allergies 07/05/2017   HSV infection 07/05/2017   Past Medical History:  Diagnosis Date   Allergy 06/2008   Seasonal   HSV infection    Varicose veins of legs    Past Surgical History:  Procedure Laterality Date   ANTERIOR CRUCIATE LIGAMENT REPAIR Right 06/02/2021   MCL, ACL meniscus   CHOLECYSTECTOMY     ENDOVENOUS ABLATION SAPHENOUS VEIN W/ LASER Left 03/21/2019   endovenous laser ablation left greater saphenous vein and stab phlebectomy 10-20 incisions left leg by Medford Blade MD    Social History   Tobacco Use   Smoking status: Never    Passive exposure: Past   Smokeless tobacco: Never   Tobacco comments:    Never tried a cigarette =)  Vaping Use   Vaping status: Never Used  Substance Use Topics   Alcohol use: Yes    Comment: social   Drug use: Never   Family History  Problem Relation Age of Onset   Aortic aneurysm Father    Heart disease Father    Hyperlipidemia Father    Hypertension Father    Varicose Veins Father    Lung cancer Maternal Grandmother    Cancer Maternal Grandmother    COPD Maternal Grandmother    Cancer Maternal Grandfather 55       prostate   Dementia Maternal Grandfather    Early death Paternal Grandmother    Alcohol abuse Paternal Grandfather    Aortic aneurysm Cousin     Miscarriages / Stillbirths Sister    Varicose Veins Sister    No Known Allergies  ROS Negative unless stated above    Objective:     BP 98/62 (BP Location: Left Arm, Patient Position: Sitting, Cuff Size: Normal)   Pulse 81   Temp 98.4 F (36.9 C) (Oral)   Ht 5' 7 (1.702 m)   Wt 146 lb 3.2 oz (66.3 kg)   LMP  (LMP Unknown) Comment: once every 3 months  SpO2 96%   BMI 22.90 kg/m  BP Readings from Last 3 Encounters:  10/04/23 98/62  07/05/23 104/68  08/17/22 110/72   Wt Readings from Last 3 Encounters:  10/04/23 146 lb 3.2 oz (66.3 kg)  07/05/23 151 lb (68.5 kg)  08/17/22 196 lb 9.6 oz (89.2 kg)      Physical Exam Constitutional:      Appearance: Normal appearance.  HENT:     Head: Normocephalic and atraumatic.     Right Ear: Tympanic membrane, ear canal and external ear normal.     Left Ear: Tympanic membrane, ear canal and external ear normal.     Nose: Nose normal.     Mouth/Throat:     Mouth: Mucous membranes are moist.     Pharynx: No  oropharyngeal exudate or posterior oropharyngeal erythema.  Eyes:     General: No scleral icterus.    Extraocular Movements: Extraocular movements intact.     Conjunctiva/sclera: Conjunctivae normal.     Pupils: Pupils are equal, round, and reactive to light.  Neck:     Thyroid : No thyromegaly.  Cardiovascular:     Rate and Rhythm: Normal rate and regular rhythm.     Pulses: Normal pulses.     Heart sounds: Normal heart sounds. No murmur heard.    No friction rub.  Pulmonary:     Effort: Pulmonary effort is normal.     Breath sounds: Normal breath sounds. No wheezing, rhonchi or rales.  Abdominal:     General: Bowel sounds are normal.     Palpations: Abdomen is soft.     Tenderness: There is no abdominal tenderness.  Musculoskeletal:        General: No deformity. Normal range of motion.  Lymphadenopathy:     Cervical: No cervical adenopathy.  Skin:    General: Skin is warm and dry.     Findings: No lesion.   Neurological:     General: No focal deficit present.     Mental Status: She is alert and oriented to person, place, and time.  Psychiatric:        Mood and Affect: Mood normal.        Thought Content: Thought content normal.        07/05/2023    1:38 PM 08/17/2022   10:17 AM 08/12/2021    8:13 AM  Depression screen PHQ 2/9  Decreased Interest 0 0 0  Down, Depressed, Hopeless 0 0 0  PHQ - 2 Score 0 0 0  Altered sleeping  0 0  Tired, decreased energy  0 1  Change in appetite  0 0  Feeling bad or failure about yourself   0 1  Trouble concentrating  0 0  Moving slowly or fidgety/restless  0 0  Suicidal thoughts  0 0  PHQ-9 Score  0 2  Difficult doing work/chores   Not difficult at all      08/17/2022   10:17 AM 09/13/2019    2:27 PM  GAD 7 : Generalized Anxiety Score  Nervous, Anxious, on Edge 0 0  Control/stop worrying 0 0  Worry too much - different things 0 0  Trouble relaxing 0 0  Restless 0 0  Easily annoyed or irritable 0 1  Afraid - awful might happen 0 0  Total GAD 7 Score 0 1  Anxiety Difficulty  Not difficult at all    No results found for any visits on 10/04/23.    Assessment & Plan:   Well adult exam -     CBC with Differential/Platelet; Future -     Comprehensive metabolic panel with GFR; Future -     Hemoglobin A1c; Future -     Lipid panel; Future -     T4, free; Future -     TSH; Future  Other orders -     Ondansetron ; Dissolve 1 tablet by mouth every 6-8 hours as needed.  Dispense: 90 tablet; Refill: 0  Age-appropriate health screenings discussed.  Immunizations reviewed.  Influenza vaccine when available.  Labs ordered.  Pap up-to-date done 07/07/2023.  Mammogram and colonoscopy not yet indicated.  Return in about 1 year (around 10/03/2024).   Clotilda JONELLE Single, MD
